# Patient Record
Sex: Female | Born: 1975 | Race: White | Hispanic: No | Marital: Married | Smoking: Never smoker
Health system: Southern US, Community
[De-identification: ages and names within clinical notes are randomized; demographics above are authoritative.]

## PROBLEM LIST (undated history)

## (undated) DIAGNOSIS — Z87440 Personal history of urinary (tract) infections: Secondary | ICD-10-CM

## (undated) DIAGNOSIS — R519 Headache, unspecified: Secondary | ICD-10-CM

## (undated) DIAGNOSIS — B999 Unspecified infectious disease: Secondary | ICD-10-CM

## (undated) DIAGNOSIS — E611 Iron deficiency: Secondary | ICD-10-CM

## (undated) DIAGNOSIS — R51 Headache: Secondary | ICD-10-CM

## (undated) HISTORY — PX: NO PAST SURGERIES: SHX2092

---

## 2005-10-15 ENCOUNTER — Other Ambulatory Visit: Admission: RE | Admit: 2005-10-15 | Discharge: 2005-10-15 | Payer: Self-pay | Admitting: Family Medicine

## 2006-03-25 ENCOUNTER — Other Ambulatory Visit: Admission: RE | Admit: 2006-03-25 | Discharge: 2006-03-25 | Payer: Self-pay | Admitting: Gynecology

## 2006-10-21 ENCOUNTER — Inpatient Hospital Stay (HOSPITAL_COMMUNITY): Admission: AD | Admit: 2006-10-21 | Discharge: 2006-10-23 | Payer: Self-pay | Admitting: Obstetrics and Gynecology

## 2008-09-03 ENCOUNTER — Inpatient Hospital Stay (HOSPITAL_COMMUNITY): Admission: AD | Admit: 2008-09-03 | Discharge: 2008-09-04 | Payer: Self-pay | Admitting: Obstetrics and Gynecology

## 2010-08-10 ENCOUNTER — Inpatient Hospital Stay (HOSPITAL_COMMUNITY): Admission: AD | Admit: 2010-08-10 | Payer: Self-pay | Admitting: Obstetrics and Gynecology

## 2010-11-25 LAB — CBC
HCT: 32.8 % — ABNORMAL LOW (ref 36.0–46.0)
Hemoglobin: 11 g/dL — ABNORMAL LOW (ref 12.0–15.0)
MCV: 83.3 fL (ref 78.0–100.0)
RBC: 3.9 MIL/uL (ref 3.87–5.11)
RBC: 4.18 MIL/uL (ref 3.87–5.11)
RDW: 14.3 % (ref 11.5–15.5)
WBC: 12.3 10*3/uL — ABNORMAL HIGH (ref 4.0–10.5)
WBC: 14.9 10*3/uL — ABNORMAL HIGH (ref 4.0–10.5)

## 2010-11-25 LAB — RPR: RPR Ser Ql: NONREACTIVE

## 2010-12-22 ENCOUNTER — Inpatient Hospital Stay (HOSPITAL_COMMUNITY)
Admission: AD | Admit: 2010-12-22 | Discharge: 2010-12-23 | DRG: 373 | Disposition: A | Discharge status: Home / Self Care | Payer: BC Managed Care – PPO | Source: Ambulatory Visit | Attending: Obstetrics and Gynecology | Admitting: Obstetrics and Gynecology

## 2010-12-22 LAB — CBC
HCT: 38.2 % (ref 36.0–46.0)
Hemoglobin: 12.6 g/dL (ref 12.0–15.0)
MCH: 27.9 pg (ref 26.0–34.0)
MCHC: 33 g/dL (ref 30.0–36.0)
MCV: 84.7 fL (ref 78.0–100.0)
RBC: 4.51 MIL/uL (ref 3.87–5.11)

## 2010-12-23 LAB — CBC
HCT: 33.6 % — ABNORMAL LOW (ref 36.0–46.0)
Hemoglobin: 10.8 g/dL — ABNORMAL LOW (ref 12.0–15.0)
MCH: 27.4 pg (ref 26.0–34.0)
MCHC: 32.1 g/dL (ref 30.0–36.0)
MCV: 85.3 fL (ref 78.0–100.0)
RDW: 14.6 % (ref 11.5–15.5)

## 2010-12-24 NOTE — H&P (Signed)
NAMEREILEY, BERTAGNOLLI                 ACCOUNT NO.:  [id number]   MEDICAL RECORD NO.:  [id number]          PATIENT TYPE:  INP   LOCATION:  9164                          [facility]ILITY:  [facility]   PHYSICIAN:  Hal Morales, M.D.DATE OF BIRTH:  1975-11-12   DATE OF ADMISSION:  09/03/2008  DATE OF DISCHARGE:                              HISTORY & PHYSICAL   Ms. Gains is a 35 year old, gravida 2, para 1-0-0-1, at 1 weeks who  presents complaining of uterine contractions since approximately 1 a.m.  with positive show noted.  Cervix had been 1 cm in the office last week.   Pregnancy has been remarkable for:  1. Positive group B strep.  2. Lacto-ovo vegetarian.  3. History of preeclampsia last pregnancy.   PRENATAL LABS:  Blood type is A positive, Rh antibody negative.  VDRL  nonreactive, rubella titer positive, hepatitis B surface antigen  negative, HIV is nonreactive.  GC and Chlamydia cultures were declined.  Pap was normal in August 2008.  Hemoglobin upon entering the practice  was 13.4.  It was within normal limits at 28 weeks.  The patient  declined genetic screening,  Parvo titers were done secondary to  possible exposure showing previous immunity.  Glucola was done and was  normal.  Hemoglobin at 28 weeks was 11.3.  Group B strep culture was  positive at 36 weeks.  She did have a urine culture done at 34 weeks  showing Enterococcus and was treated with Macrobid.   HISTORY OF PRESENT PREGNANCY:  The patient entered care at approximately  11 weeks.  She had an ultrasound in July that showed an [facility] of September 07, 2008  which was in congruence with her LMP dating of January 31. She  declined genetic screening.  Although she was a lacto-ovo vegetarian,  she does eat fish, eggs, cheese but no milk.  She was exposed to parvo  at work.  Titers were done showing immunity.  She had a normal Glucola  and hemoglobin was 11.3 at 29 weeks.  She had some significant insomnia  at 31 weeks when she was  given some Ambien.  She had questionable UTI at  35 weeks.  She had a urine culture showing 40,000 colonies of  Enterococcus which was sensitive to Macrobid.  She was treated with this  x7 days.  She had a positive group B strep culture done at 36 weeks.  The rest her pregnancy was essentially uncomplicated.   OBSTETRICAL HISTORY:  In 2008, she had a vaginal birth of a female  infant, weight 6 pounds 9 ounces at 40 weeks.  She was in labor 23  hours.  She had preeclampsia noted that was diagnosed during labor.  She  had no blood pressure trouble otherwise.  She did have positive group B  strep during that pregnancy.   MEDICAL HISTORY:  She is a previous oral contraceptive user and  discontinued this in April 2009.  She reports usual childhood illnesses.  She had a UTI in 2008. Her only known surgery was wisdom teeth removed  in 2004.  Her  only other hospitalization was for childbirth.  She did  have a fractured ankle at age 64.  She is a lacto-ovo vegetarian.   FAMILY HISTORY:  Maternal grandfather had emphysema.  Father had  prostate cancer and paternal grandmother had possible breast cancer.  Genetic history is unremarkable.   SOCIAL HISTORY:  The patient is married to the father of the baby.  He  is involved and supportive.  His name is Esti Demello.  The patient is  Caucasian of the [place] faith.  She is college educated.  She is a  Paediatric nurse. Her husband is also college educated.  He is also a  Education officer, environmental.  She has been followed by the Certified Nurse Midwife Service of  [place].  She denies any alcohol, drug or tobacco use during  this pregnancy.   PHYSICAL EXAMINATION:  VITAL SIGNS: Stable.  The patient is febrile.  HEENT:  Within normal limits.  LUNGS:  Breath sounds are clear.  HEART: Regular rate and rhythm without murmur.  BREASTS:  Soft and nontender.  ABDOMEN:  Fundal height is approximately 38 cm.  Estimated fetal weight  is 7 pounds.  Uterine  contractions are every 5-7 minutes, moderate  quality.  Cervix is slightly posterior, 3-4 cm, 8%, vertex at -1 station  with a small amount of bloody show.  Fetal heart rate is reactive with  no decelerations.  EXTREMITIES:  Deep tendon reflexes are 2+ without clonus.  There is  trace edema noted.   IMPRESSION:  1. Intrauterine pregnancy at 39 weeks.  2. Early labor.  3. Positive group B strep.   PLAN:  1. Admit to birthing suite with consult to Dr. Pennie Rushing as attending      physician.  2. Routine certified nurse midwife orders.  3. Plan group B strep prophylaxis with penicillin G per standard      dosing.  4. Patient prefers to ambulate initially but then may request an      epidural as labor advances.      Renaldo Reel Emilee Hero, C.N.M.      Hal Morales, M.D.  Electronically Signed    VLL/MEDQ  D:  09/03/2008  T:  09/03/2008  Job:  [id number]

## 2010-12-27 NOTE — H&P (Signed)
NAMETABBATHA, BORDELON NO.:  [id number]   MEDICAL RECORD NO.:  [id number]          PATIENT TYPE:  INP   LOCATION:  9111                          [facility]:  [facility]   PHYSICIAN:  Osborn Coho, M.D.   DATE OF BIRTH:  1976/04/18   DATE OF ADMISSION:  10/21/2006  DATE OF DISCHARGE:                              HISTORY & PHYSICAL   HISTORY OF PRESENT ILLNESS:  Ms. Behrmann is a 35 year old married white  female, primigravida, at 40-1/7 weeks, who presents with regular uterine  contractions since midnight and leaking fluid since about 11 p.m.  She  denies bleeding.  No signs and symptoms of PIH.  Her pregnancy has been  followed by the [facility] C.N.M. Service and has been remarkable  for:  (1) Travel to [place] at 14 weeks, (2) vegetarian lacto-ovo, (3)  group B strep positive.  Her prenatal labs were collected on March 09, 2006:  Hemoglobin 12.4, hematocrit 36.9, platelets 249,000.  Blood type  A-positive, antibody negative.  RPR nonreactive.  Rubella immune.  Hepatitis B surface antigen negative.  HIV nonreactive.  Pap smear  within normal limits from August 2007.  A 1-hour Glucola from July 28, 2006 was 96; hemoglobin at that time was 11.9; RPR at that time was  nonreactive.  Culture of the vaginal tract for group B strep on September 22, 2006 was positive.   HISTORY OF PRESENT PREGNANCY:  The patient presented for care at [facility] on April 06, 2006 at 11-6/7 weeks' gestation.  The patient had  her new OB lab work at [facility] and her Pap smear earlier in the  year; those records were received.  She declined nuchal translucency,  ultrasound and the quad screen.  CDC was reviewed with the patient in  terms of recommended vaccinations for travel to [place] and safety  information was also given.  Ultrasonography at 19-6/7 weeks' gestation  shows growth consistent with previous dating, confirming [facility] of October 20, 2006.  The patient remained  normotensive with size equal to dates  throughout the pregnancy.  Rest of her prenatal care was unremarkable.   OBSTETRICAL HISTORY:  She is a primigravida.   ALLERGIES:  She has no medication allergies.   MEDICAL HISTORY:  She experienced menarche at the age of 73 with 30-day  cycles lasting 4-5 days.  She has used Depo-Provera and oral  contraceptives in the past.  She reports having had the usual childhood  illnesses.   SURGICAL HISTORY:  Negative.   FAMILY MEDICAL HISTORY:  Remarkable for father of the baby's side of the  family with chronic hypertension, mother with anemia, father of the  baby's grandfather with COPD, father the baby's family with diabetes,  patient's father with prostate cancer, father of the baby's father with  throat cancer.   GENETIC HISTORY:  Remarkable for great uncle with cleft lip and palate.   SOCIAL HISTORY:  The patient is married to the father of the baby; his  name is Onalee Hua; he is involved and supportive.  They are of the [address]  faith.  The patient and father of the baby are college-educated.  The  patient is a part-time Paediatric nurse; father of the baby is a full-  time Education officer, environmental.  They deny any alcohol, tobacco or illicit drug use with  the pregnancy.   OBJECTIVE DATA:  VITAL SIGNS:  Stable.  She is afebrile.  HEENT:  Grossly within normal limits.  CHEST:  Clear to auscultation.  HEART:  Regular rate and rhythm.  ABDOMEN:  Gravid in contour with fundal height extending approximately  40 cm above the pubic symphysis.  Fetal heart rate is reactive and  reassuring.  Contractions are irregular and every 2-5 minutes and mild.  PELVIC:  Sterile speculum exam shows positive pooling, positive  Nitrazine, positive ferning with clear fluid.  Cervix is 1 cm, 70%  effaced, vertex -2.  EXTREMITIES:  Normal.   ASSESSMENT:  1. Intrauterine pregnancy at term.  2. Spontaneous rupture of membranes.  3. Group B strep positive.   PLAN:  1. To admit  to birthing suites.  2. Routine C.N.M. orders.  3. Plan penicillin G for group B strep prophylaxis.  4. Issue reviewed of increased risks of infection with rupture of      membranes and possible use of Pitocin today to augment      contractions.      Cam Hai, C.N.M.      Osborn Coho, M.D.  Electronically Signed    KS/MEDQ  D:  10/21/2006  T:  10/22/2006  Job:  [id number]

## 2011-11-20 ENCOUNTER — Telehealth: Payer: Self-pay | Admitting: Obstetrics and Gynecology

## 2011-11-20 NOTE — Telephone Encounter (Signed)
Patient sent email and wants to change rx for birth control.  She uses [facility] and her telphone number is [phone]. af

## 2011-11-21 ENCOUNTER — Telehealth: Payer: Self-pay | Admitting: Obstetrics and Gynecology

## 2011-11-21 NOTE — Telephone Encounter (Signed)
chandra has cht 

## 2011-11-21 NOTE — Telephone Encounter (Signed)
PC TO PT PER TELEPHONE NOTE. LM ON VM TO CB.

## 2011-11-24 MED ORDER — NORGESTIM-ETH ESTRAD TRIPHASIC 0.18/0.215/0.25 MG-35 MCG PO TABS: 1.0000 | ORAL_TABLET | Freq: Every day | ORAL | Status: DC

## 2011-11-24 NOTE — Telephone Encounter (Signed)
PC TO PT RGDG RX RF. PT STATES,"NO LONGER BREASTFEEDING AND WANTS TO RESTART ORTHO TRICYCLEN. CONSULTED WITH CORI,NP AND OK TO RF THRU 02/2012 WHEN AEX IS DUE. PT VOICES UNDERSTANDING. RX SENT TO [facility].

## 2011-11-24 NOTE — Telephone Encounter (Signed)
Addended by: Lerry Liner D on: 11/24/2011 02:18 PM   Modules accepted: Orders

## 2012-04-02 ENCOUNTER — Telehealth: Payer: Self-pay | Admitting: Obstetrics and Gynecology

## 2012-04-02 NOTE — Telephone Encounter (Signed)
JACKIE/PAM/RX REQ.

## 2012-04-05 ENCOUNTER — Telehealth: Payer: Self-pay

## 2012-04-05 NOTE — Telephone Encounter (Signed)
Pharmacy called requesting RF on Pt's OTC bcp. OK x 1 per protocol,but pt needs to sched AEX prior to further RF's. JO, CMA.

## 2012-05-20 LAB — OB RESULTS CONSOLE ABO/RH

## 2012-05-20 LAB — OB RESULTS CONSOLE HEPATITIS B SURFACE ANTIGEN: Hepatitis B Surface Ag: NEGATIVE

## 2012-05-20 LAB — OB RESULTS CONSOLE RUBELLA ANTIBODY, IGM: Rubella: IMMUNE

## 2012-05-20 LAB — OB RESULTS CONSOLE ANTIBODY SCREEN: Antibody Screen: NEGATIVE

## 2012-05-20 LAB — OB RESULTS CONSOLE HIV ANTIBODY (ROUTINE TESTING): HIV: NONREACTIVE

## 2012-08-11 NOTE — L&D Delivery Note (Signed)
Delivery Note At 6:16 PM a non-viable female was delivered via Vaginal, Spontaneous Delivery.  APGAR: 0, 0; weight 9.2 oz (260 g).   Placenta status: Intact, Spontaneous.  Cord pH: not collected  Called to rm for delivery, when I entered room baby was in mothers arms, and placenta in container on delivery table.  Placenta examined and intact.  Offered comfort, pt requested Motrin.  Anesthesia: None  Episiotomy: None Lacerations: None Suture Repair: n/a Est. Blood Loss (mL): 100  Mom to med-surg for over night observation.  Baby to mother, then to warmer to prepare to pictures and to weigh.. Mother appropriately grieving and in stable condition when I left the room.  Holly Stokes 02/09/2013, 7:01 PM

## 2012-11-17 LAB — OB RESULTS CONSOLE ABO/RH: RH Type: POSITIVE

## 2012-11-17 LAB — OB RESULTS CONSOLE RPR: RPR: NONREACTIVE

## 2012-11-17 LAB — OB RESULTS CONSOLE HEPATITIS B SURFACE ANTIGEN: Hepatitis B Surface Ag: NEGATIVE

## 2013-01-25 ENCOUNTER — Other Ambulatory Visit: Payer: Self-pay

## 2013-01-26 ENCOUNTER — Encounter (HOSPITAL_COMMUNITY): Payer: Self-pay | Admitting: Obstetrics and Gynecology

## 2013-01-26 ENCOUNTER — Other Ambulatory Visit: Payer: Self-pay | Admitting: Obstetrics and Gynecology

## 2013-02-02 ENCOUNTER — Ambulatory Visit (HOSPITAL_COMMUNITY): Payer: BC Managed Care – PPO

## 2013-02-02 ENCOUNTER — Encounter (HOSPITAL_COMMUNITY): Payer: BC Managed Care – PPO

## 2013-02-04 ENCOUNTER — Inpatient Hospital Stay (HOSPITAL_COMMUNITY)
Admission: AD | Admit: 2013-02-04 | Discharge: 2013-02-04 | Discharge status: Against Medical Advice | Payer: BC Managed Care – PPO | Attending: Obstetrics and Gynecology | Admitting: Obstetrics and Gynecology

## 2013-02-04 ENCOUNTER — Ambulatory Visit (HOSPITAL_COMMUNITY)
Admission: RE | Admit: 2013-02-04 | Discharge: 2013-02-04 | Disposition: A | Discharge status: Home / Self Care | Payer: BC Managed Care – PPO | Source: Ambulatory Visit | Attending: Obstetrics and Gynecology | Admitting: Obstetrics and Gynecology

## 2013-02-04 ENCOUNTER — Encounter (HOSPITAL_COMMUNITY): Payer: Self-pay

## 2013-02-04 ENCOUNTER — Ambulatory Visit (HOSPITAL_COMMUNITY): Payer: BC Managed Care – PPO

## 2013-02-04 ENCOUNTER — Other Ambulatory Visit: Payer: Self-pay | Admitting: Obstetrics and Gynecology

## 2013-02-04 VITALS — BP 126/87 | HR 112 | Wt 159.0 lb

## 2013-02-04 DIAGNOSIS — Z1389 Encounter for screening for other disorder: Secondary | ICD-10-CM | POA: Insufficient documentation

## 2013-02-04 DIAGNOSIS — Z363 Encounter for antenatal screening for malformations: Secondary | ICD-10-CM | POA: Insufficient documentation

## 2013-02-04 DIAGNOSIS — O021 Missed abortion: Secondary | ICD-10-CM | POA: Insufficient documentation

## 2013-02-04 DIAGNOSIS — O358XX0 Maternal care for other (suspected) fetal abnormality and damage, not applicable or unspecified: Secondary | ICD-10-CM | POA: Insufficient documentation

## 2013-02-04 NOTE — Progress Notes (Addendum)
Holly Stokes  was seen today for an ultrasound appointment.  See full report in [facility].  Comments: [name] was seen today due to an enlarged fetal bladder and bilateral renal pylectasis that was noted on recent outside ultrasound.  She also reports some possible leakage of fluid. On exam, an intrauterine fetal demise is noted.  The amniotic fluid volume appears subjectively decreased with a single fluid pocket noted near the uterine fundus measuring 3.1 cm.  Limited views of the fetal anatomy were obtained due to fetal position and subjectively decreased amnotic fluid.    Impression: Single IUP at 20 1/7 weeks by dates Intrauterine fetal demise BPD by today's study c/w 17 1/7 weeks  Recommendations: Findings discussed with Dr. Pennie Rushing who will make arrangements for labor induction.  Alpha Gula, MD

## 2013-02-04 NOTE — ED Notes (Signed)
Pt states she had some leaking yesterday.  Called her midwife and discussed with her.  Advised everything was ok.

## 2013-02-08 ENCOUNTER — Encounter (HOSPITAL_COMMUNITY): Payer: Self-pay | Admitting: *Deleted

## 2013-02-08 ENCOUNTER — Encounter (HOSPITAL_COMMUNITY): Payer: Self-pay | Admitting: Obstetrics and Gynecology

## 2013-02-08 ENCOUNTER — Inpatient Hospital Stay (HOSPITAL_COMMUNITY)
Admission: AD | Admit: 2013-02-08 | Discharge: 2013-02-10 | DRG: 380 | Disposition: A | Discharge status: Home / Self Care | Payer: BC Managed Care – PPO | Source: Ambulatory Visit | Attending: Obstetrics and Gynecology | Admitting: Obstetrics and Gynecology

## 2013-02-08 DIAGNOSIS — O99891 Other specified diseases and conditions complicating pregnancy: Secondary | ICD-10-CM | POA: Diagnosis present

## 2013-02-08 DIAGNOSIS — O2340 Unspecified infection of urinary tract in pregnancy, unspecified trimester: Secondary | ICD-10-CM | POA: Diagnosis present

## 2013-02-08 DIAGNOSIS — O358XX0 Maternal care for other (suspected) fetal abnormality and damage, not applicable or unspecified: Secondary | ICD-10-CM | POA: Diagnosis present

## 2013-02-08 DIAGNOSIS — IMO0002 Reserved for concepts with insufficient information to code with codable children: Secondary | ICD-10-CM | POA: Diagnosis present

## 2013-02-08 DIAGNOSIS — O09529 Supervision of elderly multigravida, unspecified trimester: Secondary | ICD-10-CM | POA: Diagnosis present

## 2013-02-08 DIAGNOSIS — O021 Missed abortion: Principal | ICD-10-CM | POA: Diagnosis present

## 2013-02-08 DIAGNOSIS — O09299 Supervision of pregnancy with other poor reproductive or obstetric history, unspecified trimester: Secondary | ICD-10-CM

## 2013-02-08 DIAGNOSIS — Z22330 Carrier of Group B streptococcus: Secondary | ICD-10-CM

## 2013-02-08 DIAGNOSIS — B951 Streptococcus, group B, as the cause of diseases classified elsewhere: Secondary | ICD-10-CM | POA: Diagnosis present

## 2013-02-08 LAB — CBC
HCT: 37.1 % (ref 36.0–46.0)
Hemoglobin: 13 g/dL (ref 12.0–15.0)
MCHC: 35 g/dL (ref 30.0–36.0)

## 2013-02-08 LAB — DIC (DISSEMINATED INTRAVASCULAR COAGULATION)PANEL
Fibrinogen: 451 mg/dL (ref 204–475)
Smear Review: NONE SEEN
aPTT: 28 seconds (ref 24–37)

## 2013-02-08 LAB — OB RESULTS CONSOLE GBS: GBS: POSITIVE

## 2013-02-08 MED ORDER — CITRIC ACID-SODIUM CITRATE 334-500 MG/5ML PO SOLN: 30.0000 mL | ORAL | Status: DC | PRN

## 2013-02-08 MED ORDER — FLEET ENEMA 7-19 GM/118ML RE ENEM: 1.0000 | ENEMA | RECTAL | Status: DC | PRN

## 2013-02-08 MED ORDER — MISOPROSTOL 200 MCG PO TABS
200.0000 ug | ORAL_TABLET | Freq: Once | ORAL | Status: AC
  Administered 2013-02-08: 200 ug via VAGINAL
  Filled 2013-02-08 (×2): qty 1

## 2013-02-08 MED ORDER — MISOPROSTOL 200 MCG PO TABS
400.0000 ug | ORAL_TABLET | Freq: Once | ORAL | Status: AC
  Administered 2013-02-09: 400 ug via VAGINAL
  Filled 2013-02-08: qty 2

## 2013-02-08 MED ORDER — ONDANSETRON HCL 4 MG/2ML IJ SOLN: 4.0000 mg | Freq: Four times a day (QID) | INTRAMUSCULAR | Status: DC | PRN

## 2013-02-08 MED ORDER — IBUPROFEN 600 MG PO TABS
600.0000 mg | ORAL_TABLET | Freq: Four times a day (QID) | ORAL | Status: DC | PRN
  Administered 2013-02-09: 600 mg via ORAL
  Filled 2013-02-08 (×2): qty 1

## 2013-02-08 MED ORDER — ACETAMINOPHEN 325 MG PO TABS: 650.0000 mg | ORAL_TABLET | ORAL | Status: DC | PRN

## 2013-02-08 MED ORDER — OXYTOCIN 40 UNITS IN LACTATED RINGERS INFUSION - SIMPLE MED: 62.5000 mL/h | INTRAVENOUS | Status: DC

## 2013-02-08 MED ORDER — OXYTOCIN BOLUS FROM INFUSION: 500.0000 mL | INTRAVENOUS | Status: DC

## 2013-02-08 MED ORDER — OXYCODONE-ACETAMINOPHEN 5-325 MG PO TABS: 1.0000 | ORAL_TABLET | ORAL | Status: DC | PRN

## 2013-02-08 MED ORDER — LIDOCAINE HCL (PF) 1 % IJ SOLN
30.0000 mL | INTRAMUSCULAR | Status: DC | PRN
  Filled 2013-02-08: qty 30

## 2013-02-08 MED ORDER — LACTATED RINGERS IV SOLN: 500.0000 mL | INTRAVENOUS | Status: DC | PRN

## 2013-02-08 MED ORDER — DIPHENHYDRAMINE HCL 50 MG/ML IJ SOLN
12.5000 mg | INTRAMUSCULAR | Status: DC | PRN
  Administered 2013-02-08: 12.5 mg via INTRAVENOUS
  Filled 2013-02-08: qty 1

## 2013-02-08 MED ORDER — MISOPROSTOL 25 MCG QUARTER TABLET
100.0000 ug | ORAL_TABLET | Freq: Once | ORAL | Status: AC
  Administered 2013-02-08: 100 ug via VAGINAL

## 2013-02-08 MED ORDER — LACTATED RINGERS IV SOLN
INTRAVENOUS | Status: DC
  Administered 2013-02-08: 20:00:00 via INTRAVENOUS

## 2013-02-08 NOTE — Consult Note (Signed)
Physician Discharge Summary  Patient ID: Holly Stokes MRN: [id number] DOB/AGE: 37/27/1977 37 y.o.  Admit date: 02/04/2013 Discharge date: 02/04/13  Admission Diagnoses: [redacted] wk gestation     Abnormal ultrasound     Intrauterine fetal demise     Questionable fluid leakage  Discharge Diagnoses: [redacted] week gestation     Abnormal ultrasound     Intrauterine fetal demise     No evidence rupture of membranes     Pt declines recommended admission for induction of labor   Discharged Condition: stable  Hospital Course: the patient was seen by Dr. Claudean Severance in MFM with the diagnosis of IUFD made by ultrasound. I was notified of this finding and spoke with the patient in MFM.  I expressed by condolences for her loss.  Induction of labor was recommended.  Pt declined and was the risk of development of DIC with its potential life threatening sequalae reviewed.  She gave a hx of possibly leaking fluid yesterday but denied any contractions or bleeding.  Amnisure was performed and was negative.  VS were stable.  The patient consulted with her family members and together they decided to delay induction until Monday 02/07/13.  She was given precautions to call for leakage of fluid, bleeding or contractions.  Consults: MFM  Significant Diagnostic Studies: Ultrasound and Amnisure  Treatments: none  Discharge Exam: Cx closed per exam by Denny Levy CNM  Disposition: In light of the negative Amnisure, the patient wants to go home and continue to think about her options.  She said she wanted to delay induction until 02/07/13     Signed: Lawsen Arnott P 02/08/2013, 11:32 AM

## 2013-02-08 NOTE — Progress Notes (Signed)
Subjective: Denies cramping/abdominal pain.  Pt was sleeping on couch on arrival to room and husband was sitting in bed watching tv.  Pt denies VB or LOF.  Objective: BP 128/66  Pulse 71  Temp(Src) 98.2 F (36.8 C) (Oral)  Ht 5\' 7"  (1.702 m)  Wt 160 lb (72.576 kg)  BMI 25.05 kg/m2      SVE:   Dilation: Fingertip Exam by:: Paulette Blanch CNM  Labs: Lab Results  Component Value Date   WBC 13.9* 02/08/2013   HGB 13.0 02/08/2013   HCT 37.1 02/08/2013   MCV 86.1 02/08/2013   PLT 288 02/08/2013   PLT 288 02/08/2013    Assessment / Plan: 1. 21 weeks 2. IUFD 3. AMA 4. megabladder on u/s  Labor: induction in progress Preeclampsia:  no signs or symptoms of toxicity Fetal Wellbeing:  n/a Pain Control:  Labor support without medications I/D:  n/a Anticipated MOD:  NSVD 1. Placed 2nd dose of cytotec ( this time).   2. Offered sleep aid and pt to consider, but appears interested in Benadryl IV 3. CTO as needed; pain interventions prn pt's desire 4. C/w MD prn  Christ Fullenwider H 02/08/2013, 10:58 PM

## 2013-02-08 NOTE — H&P (Signed)
Holly Stokes is a 37 y.o. female, [phone] at [redacted]w[redacted]d, presenting for IOL after IUFD about a week ago.  Patient Active Problem List   Diagnosis Date Noted  . GBS (group B streptococcus) UTI complicating pregnancy 03-09-13  . H/O pre-eclampsia in prior pregnancy, currently pregnant 03-09-13  . IUFD (intrauterine fetal death) 2013/03/09    History of present pregnancy: Patient entered care at 10 weeks.   EDC of 06/21/13 was established by U/S at [redacted]w[redacted]d.   Anatomy scan:  19 weeks, with normal findings and an anterior placenta.  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   Significant prenatal events:  none   Last evaluation:  03/09/13 at 21 weeks  OB History   Grav Para Term Preterm Abortions TAB SAB Ect Mult Living   4 3 3  0 0 0 0 0 0 3     Past Medical History  Diagnosis Date  . Pregnancy induced hypertension 2008   Past Surgical History  Procedure Laterality Date  . No past surgeries     Family History: family history is not on file. Social History:  reports that she has never smoked. She has never used smokeless tobacco. She reports that she does not drink alcohol or use illicit drugs.    ROS: see HPI above, all other systems are negative  No Known Allergies     Blood pressure 128/66, pulse 71, temperature 98.2 F (36.8 C), temperature source Oral, height 5\' 7"  (1.702 m), weight 160 lb (72.576 kg).  Chest clear Heart RRR without murmur Abd gravid, NT Pelvic: deferred until cyctotec placement Ext: WNL   Prenatal labs: ABO, Rh: A/Positive/-- (04/09 0000) Antibody: Negative (04/09 0000) Rubella:   Immune RPR: Nonreactive (04/09 0000)  HBsAg: Negative (04/09 0000)  HIV: Non-reactive (04/09 0000)  GBS:  Pos by urine culture Sickle cell/Hgb electrophoresis:  n/a Pap:  02/13/11 WNL GC:  neg Chlamydia:  neg Genetic  screenings:  declined Glucola:  n/a Other:  none  Assessment/Plan: IUFD at [redacted]w[redacted]d High risk PPH  Admit to BS for IOL per c/w Dr. Pennie Rushing as attending Cytotec induction PCA or Epidural prn DIC panel To consent for D&E and Hysterotomy   Rowan Blase, MSN March 09, 2013, 5:46 PM

## 2013-02-09 ENCOUNTER — Encounter (HOSPITAL_COMMUNITY): Payer: Self-pay | Admitting: *Deleted

## 2013-02-09 DIAGNOSIS — IMO0002 Reserved for concepts with insufficient information to code with codable children: Secondary | ICD-10-CM | POA: Diagnosis not present

## 2013-02-09 MED ORDER — ONDANSETRON HCL 4 MG/2ML IJ SOLN: 4.0000 mg | INTRAMUSCULAR | Status: DC | PRN

## 2013-02-09 MED ORDER — WITCH HAZEL-GLYCERIN EX PADS: 1.0000 "application " | MEDICATED_PAD | CUTANEOUS | Status: DC | PRN

## 2013-02-09 MED ORDER — TETANUS-DIPHTH-ACELL PERTUSSIS 5-2.5-18.5 LF-MCG/0.5 IM SUSP: 0.5000 mL | Freq: Once | INTRAMUSCULAR | Status: DC

## 2013-02-09 MED ORDER — OXYCODONE-ACETAMINOPHEN 5-325 MG PO TABS: 1.0000 | ORAL_TABLET | ORAL | Status: DC | PRN

## 2013-02-09 MED ORDER — ZOLPIDEM TARTRATE 5 MG PO TABS: 5.0000 mg | ORAL_TABLET | Freq: Every evening | ORAL | Status: DC | PRN

## 2013-02-09 MED ORDER — ONDANSETRON HCL 4 MG PO TABS: 4.0000 mg | ORAL_TABLET | ORAL | Status: DC | PRN

## 2013-02-09 MED ORDER — SIMETHICONE 80 MG PO CHEW: 80.0000 mg | CHEWABLE_TABLET | ORAL | Status: DC | PRN

## 2013-02-09 MED ORDER — DIPHENHYDRAMINE HCL 25 MG PO CAPS
25.0000 mg | ORAL_CAPSULE | Freq: Four times a day (QID) | ORAL | Status: DC | PRN
  Administered 2013-02-09: 25 mg via ORAL
  Filled 2013-02-09: qty 1

## 2013-02-09 MED ORDER — BENZOCAINE-MENTHOL 20-0.5 % EX AERO
1.0000 "application " | INHALATION_SPRAY | CUTANEOUS | Status: DC | PRN
  Filled 2013-02-09: qty 56

## 2013-02-09 MED ORDER — SENNOSIDES-DOCUSATE SODIUM 8.6-50 MG PO TABS
2.0000 | ORAL_TABLET | Freq: Every day | ORAL | Status: DC
  Administered 2013-02-09: 2 via ORAL

## 2013-02-09 MED ORDER — LANOLIN HYDROUS EX OINT: TOPICAL_OINTMENT | CUTANEOUS | Status: DC | PRN

## 2013-02-09 MED ORDER — FENTANYL CITRATE 0.05 MG/ML IJ SOLN
100.0000 ug | INTRAMUSCULAR | Status: DC | PRN
  Filled 2013-02-09: qty 2

## 2013-02-09 MED ORDER — PRENATAL MULTIVITAMIN CH: 1.0000 | ORAL_TABLET | Freq: Every day | ORAL | Status: DC

## 2013-02-09 MED ORDER — DIBUCAINE 1 % RE OINT: 1.0000 "application " | TOPICAL_OINTMENT | RECTAL | Status: DC | PRN

## 2013-02-09 MED ORDER — MISOPROSTOL 200 MCG PO TABS
800.0000 ug | ORAL_TABLET | Freq: Once | ORAL | Status: AC
  Administered 2013-02-09: 800 ug via VAGINAL
  Filled 2013-02-09: qty 4

## 2013-02-09 MED ORDER — IBUPROFEN 600 MG PO TABS
600.0000 mg | ORAL_TABLET | Freq: Four times a day (QID) | ORAL | Status: DC
  Filled 2013-02-09 (×3): qty 1

## 2013-02-09 NOTE — Progress Notes (Signed)
02/09/13 0900  Clinical Encounter Type  Visited With Patient and family together (husband Holly Stokes)  Visit Type Initial;Spiritual support;Social support  Referral From Nurse Venda Rodes, RN)  Recommendations Spiritual Care will follow for support.  Spiritual Encounters  Spiritual Needs Grief support;Emotional  Stress Factors  Patient Stress Factors Loss (IUFD)  Family Stress Factors Loss (IUFD)   Thank you to Venda Rodes, RN for consult.  Made initial visit with Holly Stokes and husband Holly Stokes to offer spiritual and emotional support.  They report good support, desire space to themselves at this time, and are aware of ongoing chaplain availability.  Will follow for support.  [address] [place], [place] [phone]

## 2013-02-09 NOTE — Progress Notes (Signed)
  Subjective: Called to bedside by RN d/t pt reporting more cramping and some vaginal pressure. Pt sitting in bed.   Objective: BP 115/55  Pulse 70  Temp(Src) 98.7 F (37.1 C) (Oral)  Resp 18  Ht 5\' 7"  (1.702 m)  Wt 160 lb (72.576 kg)  BMI 25.05 kg/m2      SVE:   Dilation: 1.5 Effacement (%): 50 Exam by:: jeenifer Dietrich Samuelson, cnm -3 Posterior Soft Many tablets noted in vaginal vault  Assessment / Plan:  Per c/w Dr. Normand Sloop will continue to let the Cytotec dissolve Toco applied for UC monitoring  Labor: IOL for IUFD Preeclampsia: no s/s Fetal Wellbeing: IUFD Pain Control: Labor support without medication  I/D: n/a  Anticipated MOD: NSVD    Krysia Zahradnik 02/09/2013, 11:21 AM

## 2013-02-09 NOTE — Progress Notes (Signed)
  Subjective: Pt sitting in bed watching tv, husband in shower.  Pt reports she got some sleep through the night, now feeling mild UC/cramping.  Objective: BP 115/55  Pulse 70  Temp(Src) 98.7 F (37.1 C) (Oral)  Resp 18  Ht 5\' 7"  (1.702 m)  Wt 160 lb (72.576 kg)  BMI 25.05 kg/m2     SVE: FT / 50% - about 3-4 tabs noted in vaginal vault   Assessment / Plan:  4th dose of cytotec ( ) placed per c/w Dr. Normand Sloop  Labor: IOL for IUFD  Preeclampsia: No s/s Fetal Wellbeing: IUFD Pain Control: Labor support without medication I/D: DIC panel WNL on admission Anticipated MOD: NSVD   Holly Stokes 02/09/2013, 8:03 AM

## 2013-02-09 NOTE — Progress Notes (Signed)
Subjective: Pt awake on arrival to room.  Denies VB or LOF.  Reports occ'l "twinge" in LLQ, but not constant and denies need for pain medicine.  States is sharp pain, doesn't feel like a ctx.  Husband is asleep on couch at bs.  Good benefit from Benadryl IV at 2309.    Objective: BP 117/64  Pulse 69  Temp(Src) 99.6 F (37.6 C) (Oral)  Resp 18  Ht 5\' 7"  (1.702 m)  Wt 160 lb (72.576 kg)  BMI 25.05 kg/m2      SVE:   Dilation: Fingertip Effacement (%): Thick Exam by:: H Iris Hairston CNM Cx: unchanged; remnants of previous 2 cytotec pills found in vault Labs: Lab Results  Component Value Date   WBC 13.9* 02/08/2013   HGB 13.0 02/08/2013   HCT 37.1 02/08/2013   MCV 86.1 02/08/2013   PLT 288 02/08/2013   PLT 288 02/08/2013    Assessment / Plan: 1. 21weeks 2. IUFD 3. AMA 4. megabladder on u/s  Labor: induction in progress Preeclampsia:  no signs or symptoms of toxicity Fetal Wellbeing:  IUFD Pain Control:  Labor support without medications I/D:  n/a Anticipated MOD:  NSVD 1. Placed 3rd dose of cytotec ( this time) 2. CTO for labor 3. Pain interventions prn pt desire (not interested in epidural presently) 4. C/w MD prn  Harleyquinn Gasser H 02/09/2013, 3:11 AM

## 2013-02-10 LAB — CBC
Hemoglobin: 12.1 g/dL (ref 12.0–15.0)
MCH: 29.5 pg (ref 26.0–34.0)
MCV: 86.1 fL (ref 78.0–100.0)
Platelets: 271 10*3/uL (ref 150–400)
RBC: 4.1 MIL/uL (ref 3.87–5.11)
WBC: 9.9 10*3/uL (ref 4.0–10.5)

## 2013-02-10 NOTE — Discharge Summary (Signed)
Vaginal Delivery Discharge Summary  Holly Stokes  DOB:    05-31-1976 MRN:    [id number] CSN:    [id number]  Date of admission:                  02/08/13  Date of discharge:                   7/3/4  Procedures this admission:  Date of Delivery: 02/09/13  Newborn Data:  IUFD at 24 1/7 weeks Birth Weight: 9.2 oz (260 g) APGAR: 0, 0  Baby Boy Samuel  History of Present Illness:  Holly Stokes is a 37 y.o. female, [phone], who presented at [redacted] weeks gestation. The patient has been followed at the [facility] and Gynecology division of [facility]. She was admitted induction of labor due to IUFD at 74 1/2 weeks, noted on office [place] on 02/08/13.Marland Kitchen Her pregnancy has been complicated by:  Patient Active Problem List   Diagnosis Date Noted  . Fetal demise, less than 22 weeks, delivered, current hospitalization 02/09/2013  . H/O pre-eclampsia in prior pregnancy, currently pregnant 02/08/2013     Hospital course:  The patient was admitted for induction of labor due to IUFD at 21 weeks.   Cytotech was given, with accomplishment of precipitous delivery on 02/09/13.  Her labor was not complicated.  Her delivery was not complicated. Her postpartum course was not complicated.   She was grieving appropriately and desired d/c home on 02/10/13.  She was discharged to home on postpartum day 1 in stable condition.  She declined pain medication.  Perinatal grief support materials were provided to the patient. The patient declined any postmortem evaluation of the infant.  Contraception:  Undecided at present  Discharge hemoglobin:  Hemoglobin  Date Value Range Status  02/10/2013 12.1  12.0 - 15.0 g/dL Final     HCT  Date Value Range Status  02/10/2013 35.3* 36.0 - 46.0 % Final    Discharge Physical Exam:   General: alert Lochia: appropriate Uterine Fundus: firm Incision: NA DVT Evaluation: No evidence of DVT seen on physical exam. Negative Homan's  sign.  Intrapartum Procedures: SVD of IUFD at 21 1/7 weeks Postpartum Procedures: none Complications-Operative and Postpartum: none  Discharge Diagnoses: IUFD at 21 weeks, induction of labor, SVB  Discharge Information:  Activity:           pelvic rest Diet:                routine Medications: None Condition:      stable Instructions:  Call for any questions or concerns. Discharge to: home  Follow-up Information   Follow up with [facility] In 4 weeks. (Call for any questions or concerns.  Office will call you to make f/u appointment for 4 weeks.)    Contact information:   [address] [place] [address] [phone]     Office staff will call and check on patient status in 2 weeks.  Nigel Bridgeman 02/10/2013

## 2013-02-14 ENCOUNTER — Encounter (HOSPITAL_COMMUNITY): Payer: Self-pay | Admitting: *Deleted

## 2014-06-08 LAB — OB RESULTS CONSOLE RUBELLA ANTIBODY, IGM: RUBELLA: IMMUNE

## 2014-06-08 LAB — OB RESULTS CONSOLE HIV ANTIBODY (ROUTINE TESTING): HIV: NONREACTIVE

## 2014-06-08 LAB — OB RESULTS CONSOLE ABO/RH: RH TYPE: POSITIVE

## 2014-06-08 LAB — OB RESULTS CONSOLE GC/CHLAMYDIA
Chlamydia: NEGATIVE
Gonorrhea: NEGATIVE

## 2014-06-08 LAB — OB RESULTS CONSOLE RPR: RPR: NONREACTIVE

## 2014-06-08 LAB — OB RESULTS CONSOLE ANTIBODY SCREEN: ANTIBODY SCREEN: NEGATIVE

## 2014-06-08 LAB — OB RESULTS CONSOLE HEPATITIS B SURFACE ANTIGEN: Hepatitis B Surface Ag: NEGATIVE

## 2014-06-12 ENCOUNTER — Encounter (HOSPITAL_COMMUNITY): Payer: Self-pay | Admitting: *Deleted

## 2014-06-15 ENCOUNTER — Other Ambulatory Visit (HOSPITAL_COMMUNITY)
Admission: RE | Admit: 2014-06-15 | Discharge: 2014-06-15 | Disposition: A | Discharge status: Home / Self Care | Payer: BC Managed Care – PPO | Source: Ambulatory Visit | Attending: Obstetrics & Gynecology | Admitting: Obstetrics & Gynecology

## 2014-06-15 ENCOUNTER — Other Ambulatory Visit: Payer: Self-pay | Admitting: Obstetrics & Gynecology

## 2014-06-15 DIAGNOSIS — Z113 Encounter for screening for infections with a predominantly sexual mode of transmission: Secondary | ICD-10-CM | POA: Diagnosis present

## 2014-06-15 DIAGNOSIS — Z01419 Encounter for gynecological examination (general) (routine) without abnormal findings: Secondary | ICD-10-CM | POA: Insufficient documentation

## 2014-06-15 DIAGNOSIS — Z1151 Encounter for screening for human papillomavirus (HPV): Secondary | ICD-10-CM | POA: Diagnosis present

## 2014-06-16 LAB — CYTOLOGY - PAP

## 2014-08-11 NOTE — L&D Delivery Note (Signed)
Delivery Note At 12:25 AM a viable female was delivered via Vaginal, Spontaneous Delivery (Presentation: ; Occiput Anterior).  APGAR: 8, 9; weight 7 lb 9.7 oz (3450 g).   Placenta status: Intact, Spontaneous.  Cord: 2 vessels with the following complications: None.  Cord pH: NA  Anesthesia: Epidural  Episiotomy: None Lacerations: 1st degree Suture Repair: 3.0 vicryl Est. Blood Loss (mL): 182  Mom to postpartum.  Baby to Couplet care / Skin to Skin.  Hasset Chaviano,Shaquilla J. 01/23/2015, 9:11 AM

## 2014-08-14 ENCOUNTER — Other Ambulatory Visit (HOSPITAL_COMMUNITY): Payer: Self-pay | Admitting: Obstetrics & Gynecology

## 2014-08-14 DIAGNOSIS — Z3689 Encounter for other specified antenatal screening: Secondary | ICD-10-CM

## 2014-08-14 DIAGNOSIS — O09522 Supervision of elderly multigravida, second trimester: Secondary | ICD-10-CM

## 2014-08-14 DIAGNOSIS — Z3A19 19 weeks gestation of pregnancy: Secondary | ICD-10-CM

## 2014-08-22 ENCOUNTER — Other Ambulatory Visit (HOSPITAL_COMMUNITY): Payer: Self-pay

## 2014-08-22 ENCOUNTER — Encounter (HOSPITAL_COMMUNITY): Payer: Self-pay

## 2014-08-25 ENCOUNTER — Other Ambulatory Visit (HOSPITAL_COMMUNITY): Payer: Self-pay

## 2014-08-25 ENCOUNTER — Ambulatory Visit (HOSPITAL_COMMUNITY)
Admission: RE | Admit: 2014-08-25 | Discharge: 2014-08-25 | Disposition: A | Discharge status: Home / Self Care | Payer: BLUE CROSS/BLUE SHIELD | Source: Ambulatory Visit | Attending: Obstetrics & Gynecology | Admitting: Obstetrics & Gynecology

## 2014-08-25 ENCOUNTER — Encounter (HOSPITAL_COMMUNITY): Payer: Self-pay

## 2014-08-25 DIAGNOSIS — O09529 Supervision of elderly multigravida, unspecified trimester: Secondary | ICD-10-CM | POA: Insufficient documentation

## 2014-08-25 DIAGNOSIS — O09522 Supervision of elderly multigravida, second trimester: Secondary | ICD-10-CM

## 2014-08-25 DIAGNOSIS — Z3A19 19 weeks gestation of pregnancy: Secondary | ICD-10-CM | POA: Diagnosis not present

## 2014-08-25 DIAGNOSIS — Z36 Encounter for antenatal screening of mother: Secondary | ICD-10-CM | POA: Diagnosis present

## 2014-08-25 DIAGNOSIS — Z3689 Encounter for other specified antenatal screening: Secondary | ICD-10-CM

## 2014-08-25 DIAGNOSIS — O09292 Supervision of pregnancy with other poor reproductive or obstetric history, second trimester: Secondary | ICD-10-CM | POA: Diagnosis not present

## 2014-08-25 IMAGING — US US OB DETAIL+14 WK
1 series · 15 of 28 positions shown · non-contrast
Comparison: none

[Series 1: ama, · 0.26mm/px · 112 acquisitions, 15 frames shown]
[im 1/112]
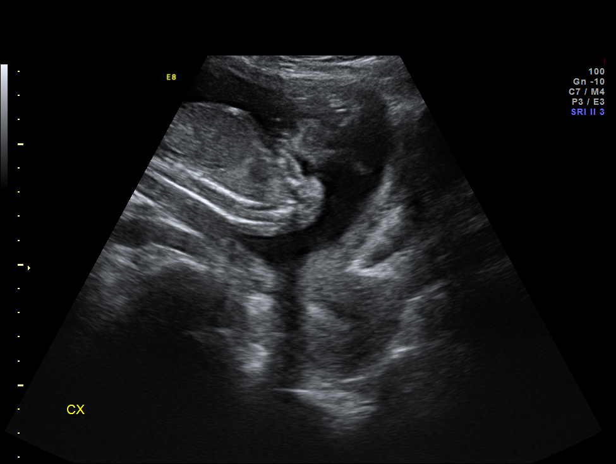
[im 9/112]
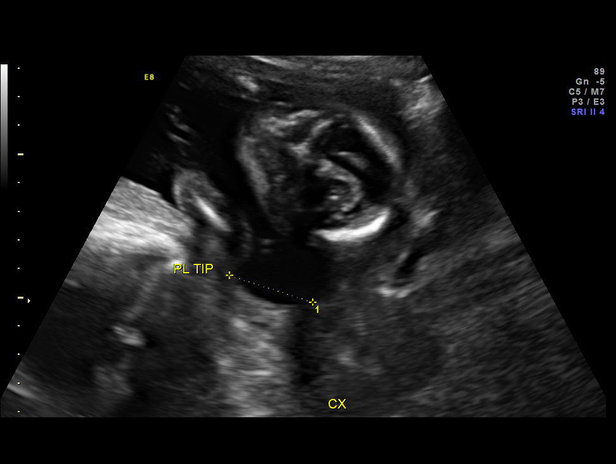
[im 17/112]
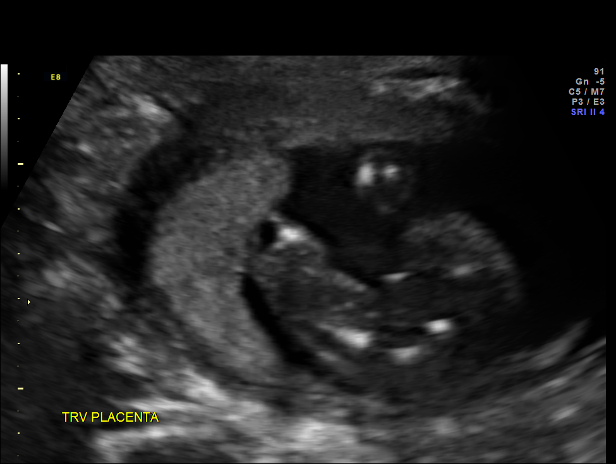
[im 25/112]
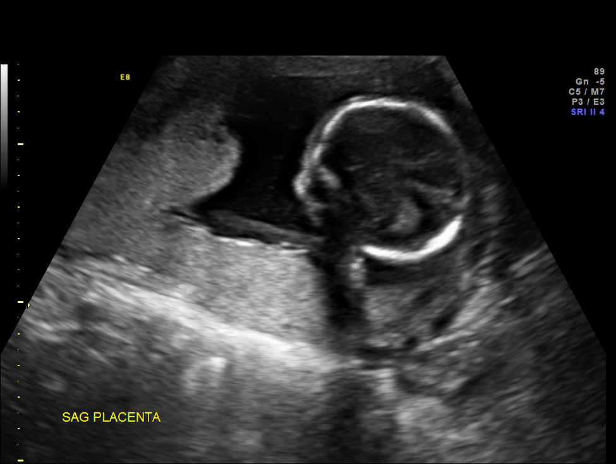
[im 33/112]
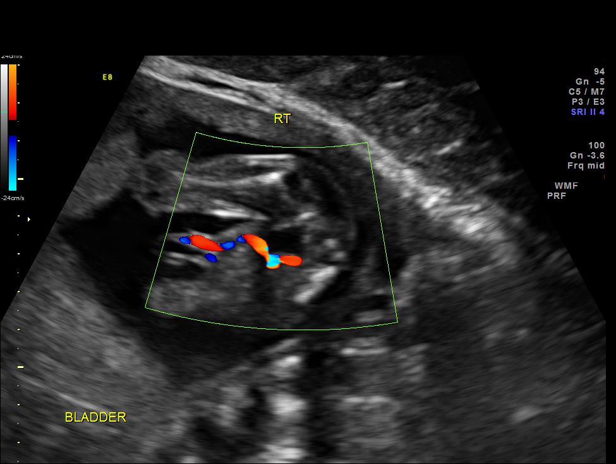
[im 42/112]
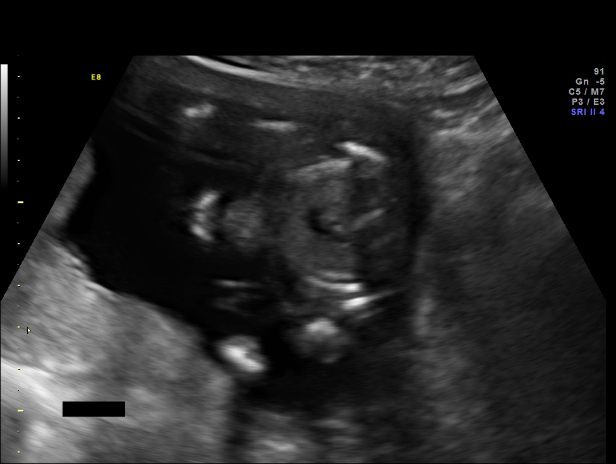
[im 50/112]
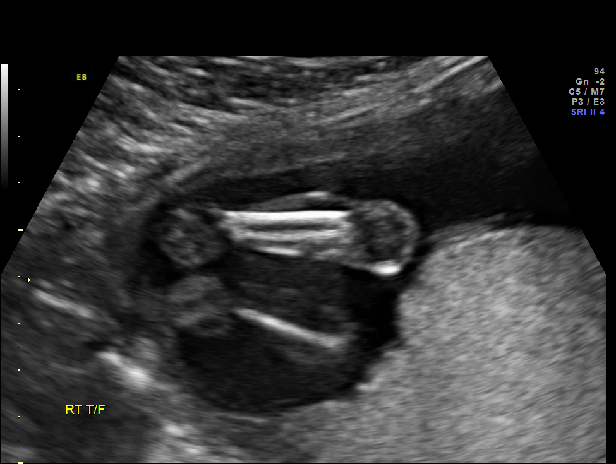
[im 58/112]
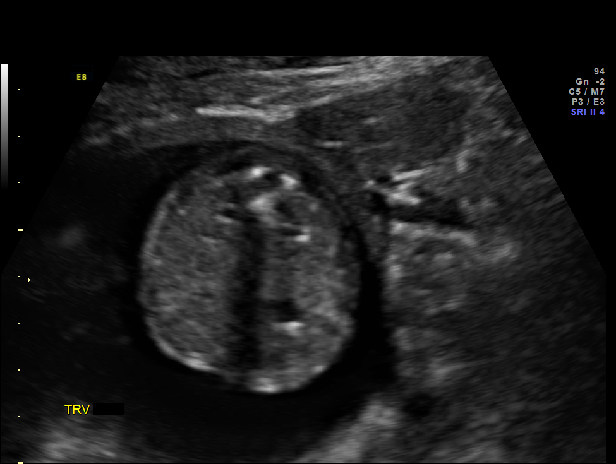
[im 62/112]
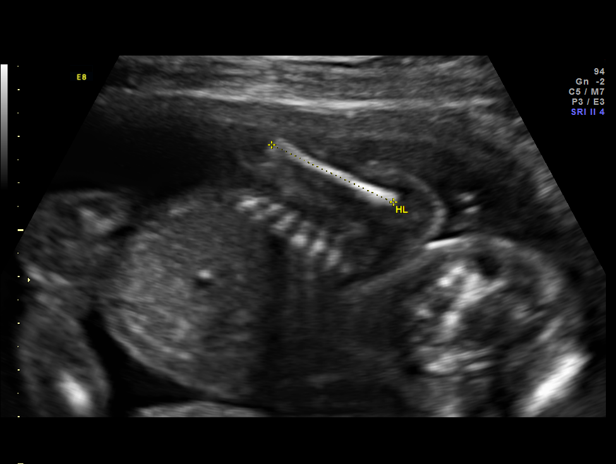
[im 70/112]
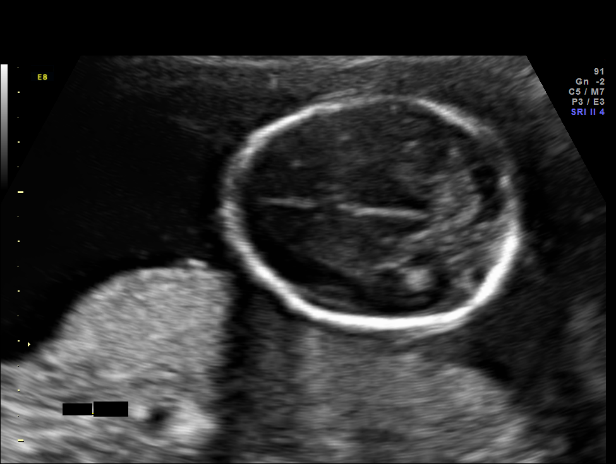
[im 79/112]
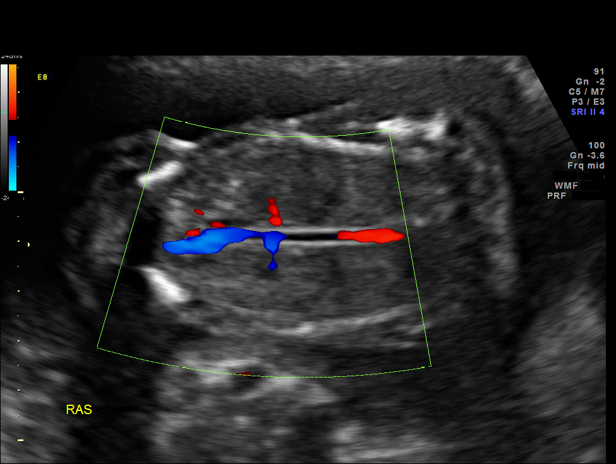
[im 87/112]
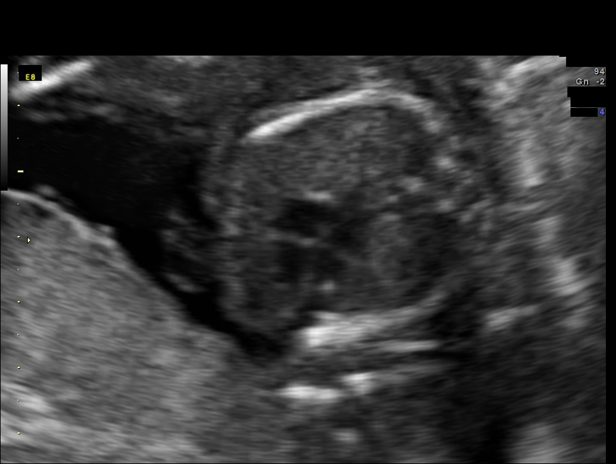
[im 95/112]
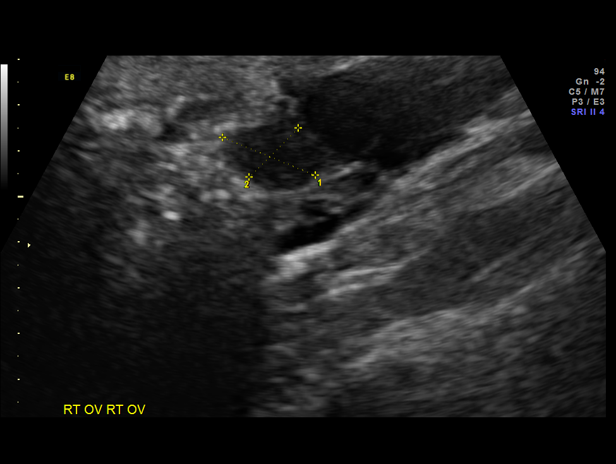
[im 103/112]
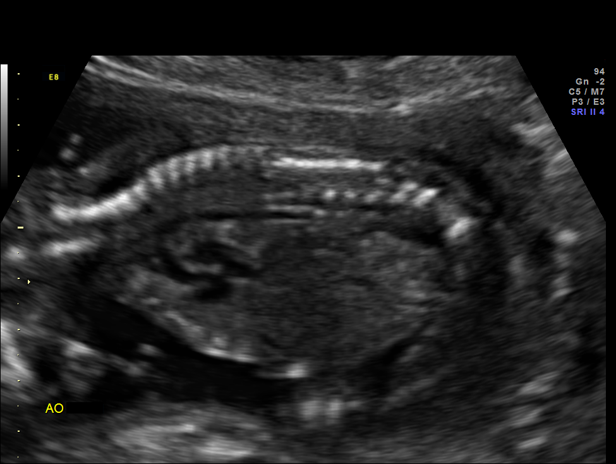
[im 112/112]
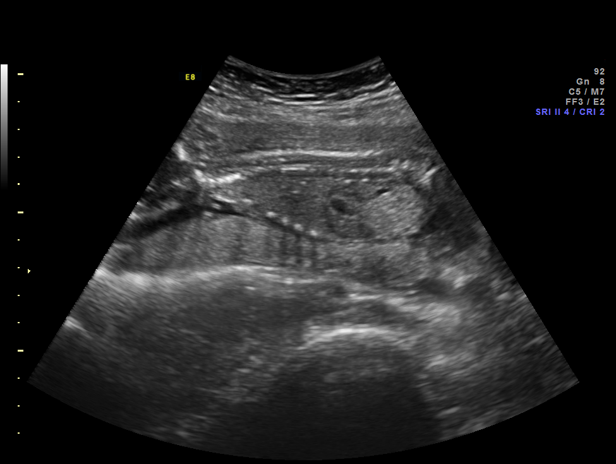

[15 of 28 positions shown; findings below may reference images not displayed]

OBSTETRICS REPORT
                      (Signed Final [DATE] [DATE])

Service(s) Provided

 US OB DETAIL + 14 WK                                  76811.0
Indications

 Advanced maternal age multigravida 35+, second        [Q1]
 trimester
 Poor obstetric history: Previous preeclampsia /       [Q1]
 eclampsia/gestational HTN
 Poor obstetric history: Previous IUFD (stillbirth)    [Q1]
 19 weeks gestation of pregnancy
Fetal Evaluation

 Num Of Fetuses:    1
 Fetal Heart Rate:  144                          bpm
 Cardiac Activity:  Observed
 Presentation:      Cephalic
 Placenta:          Posterior, above cervical
                    os
 P. Cord            Visualized, central
 Insertion:

 Amniotic Fluid
 AFI FV:      Subjectively within normal limits
                                             Larg Pckt:   6.42.  cm
Biometry

 BPD:     44.9  mm     G. Age:  19w 4d                CI:         72.7   70 - 86
 OFD:     61.8  mm                                    FL/HC:      16.6   16.1 -

 HC:     172.5  mm     G. Age:  19w 6d       79  %    HC/AC:      1.15   1.09 -

 AC:     150.2  mm     G. Age:  20w 2d       83  %    FL/BPD:
 FL:      28.7  mm     G. Age:  18w 6d       37  %    FL/AC:      19.1   20 - 24
 HUM:     29.2  mm     G. Age:  19w 4d       64  %

 Est. FW:     303  gm    0 lb 11 oz      55  %
Gestational Age

 LMP:           19w 0d        Date:  [DATE]                 EDD:   [DATE]
 U/S Today:     19w 5d                                        EDD:   [DATE]
 Best:          19w 0d     Det. By:  LMP  ([DATE])          EDD:   [DATE]
Anatomy

 Cranium:          Appears normal         Aortic Arch:      Appears normal
 Fetal Cavum:      Appears normal         Ductal Arch:      Not well visualized
 Ventricles:       Appears normal         Diaphragm:        Not well visualized
 Choroid Plexus:   Appears normal         Stomach:          Appears normal, left
                                                            sided
 Cerebellum:       Appears normal         Abdomen:          Appears normal
 Posterior Fossa:  Appears normal         Abdominal Wall:   Appears nml (cord
                                                            insert, abd wall)
 Nuchal Fold:      Not well visualized    Cord Vessels:     2 vessel cord,
                                                            absent right RUJAE
                                                            RUJAE
 Face:             Orbits appear          Kidneys:          Appear normal
                   normal
 Lips:             Appears normal         Bladder:          Appears normal
 Heart:            Echogenic focus        Spine:            Appears normal
                   in LV
 RVOT:             Not well visualized    Lower             Appears normal
                                          Extremities:
 LVOT:             Appears normal         Upper             Appears normal
                                          Extremities:

 Other:  Fetus appears to be a male. Heels visualized. Technically difficult due
         to fetal position.
Cervix Uterus Adnexa

 Cervical Length:    3.6      cm

 Cervix:       Normal appearance by transabdominal scan.
 Uterus:       No abnormality visualized.
 Cul De Sac:   No free fluid seen.
 Left Ovary:    Not visualized.
 Right Ovary:   Within normal limits.
 Adnexa:     No abnormality visualized.
Comments

 The patient's fetal anatomic survey is not complete.  An
 echogenic focus was seen in the left cardiac ventricle.  In
 addition, a single umbilical artery was identified on today's
 ultrasound.  Taken together these findings are associated
 with an increased risk of Downs syndrome.  Her risk of
 Downs on her second trimester screen was 1 in 632.  This is
 increased to 1 in 166 due to the presence of the echogenic
 intracardiac focus and the single umbilical artery.  I discussed
 the nature of these findings and their implications with Ms.
 RUJAE and her husband.  She does not wish to pursue any
 further genetic testing and declined genetic counseling.

 Several aspects of the fetal anatomy could not be seen on
 today's ultrasound including the fetal profile.  We were unable
 to completely visualize the fetal cardiac anatomy or
 determine if the fetal nasal bone was present.  If the nasal
 bone was absent or a fetal cardiac defect be identified, the
 risk of Downs syndrome would be dramatically increased
 above 1 in 166.  I offered to perform a follow up ultrasound
 here in the [facility] to attempt to visualize the remaining
 aspects of the fetal anatomy.  However, she prefers to have
 that study performed in her primary OBs office.  As a single
 umbilical artery is associated with an increased risk of
 congenital heart defects, complete visualization of the fetal
 cardiac anatomy is recommended.  A fetal echo could be
 considered to accomplish this.  In addition, if an absent nasal
 bone is confirmed genetic counseling should again be
 considered.
 Ms. RUJAE case was discussed with Dr. RUJAE today.
Impression

 Single living intrauterine pregnancy at 19 weeks 0 days.
 Appropriate fetal growth (55%).
 Normal amniotic fluid volume.
 Echogenic intracardiac focus.
 Single umbilical artery.
 See comments.
Recommendations

 See comments.

 questions or concerns.
                RUJAE

## 2014-12-23 LAB — OB RESULTS CONSOLE GBS: STREP GROUP B AG: POSITIVE

## 2015-01-17 ENCOUNTER — Inpatient Hospital Stay (HOSPITAL_COMMUNITY)
Admission: AD | Admit: 2015-01-17 | Discharge: 2015-01-17 | Disposition: A | Discharge status: Home / Self Care | Payer: BLUE CROSS/BLUE SHIELD | Source: Ambulatory Visit | Attending: Obstetrics & Gynecology | Admitting: Obstetrics & Gynecology

## 2015-01-17 ENCOUNTER — Encounter (HOSPITAL_COMMUNITY): Payer: Self-pay | Admitting: *Deleted

## 2015-01-17 DIAGNOSIS — Z3A39 39 weeks gestation of pregnancy: Secondary | ICD-10-CM | POA: Diagnosis not present

## 2015-01-17 DIAGNOSIS — O9989 Other specified diseases and conditions complicating pregnancy, childbirth and the puerperium: Secondary | ICD-10-CM | POA: Diagnosis present

## 2015-01-17 MED ORDER — LACTATED RINGERS IV BOLUS (SEPSIS)
1000.0000 mL | Freq: Once | INTRAVENOUS | Status: AC
  Administered 2015-01-17: 1000 mL via INTRAVENOUS

## 2015-01-17 NOTE — MAU Note (Signed)
Back pain that radiates to front lower abdomen Denies bright red vaginal bleeding.  Positivefetal movement Denies SROM/LOF Denies any infections/complications of pregnancy  GBS positive per patient

## 2015-01-18 ENCOUNTER — Telehealth (HOSPITAL_COMMUNITY): Payer: Self-pay | Admitting: *Deleted

## 2015-01-18 NOTE — Telephone Encounter (Signed)
Preadmission screen  

## 2015-01-20 NOTE — H&P (Signed)
HPI: 39 y/o [id number] @ [redacted]w[redacted]d estimated gestational age (as dated by LMP c/w 20 week ultrasound) presents for late term pregnancy IOL.   no Leaking of Fluid,   no Vaginal Bleeding,   irregular Uterine Contractions,  + Fetal Movement.  ROS: no HA, no epigastric pain, no visual changes.    Pregnancy complicated by: 1) Advanced maternal age  -declined first trimester screening, tetra negative.  20wk scan performed with MFM 2) Two vessel cord and intracardiac focus, declined further testing  - followed by antepartum fetal surveillance and ultrasound  -last [place] @ [redacted]w[redacted]d- vertex/post/AFI wnl, EFW: 7#6oz (66%) 3) GBS positive  -PCN in labor    Prenatal Transfer Tool  Maternal Diabetes: No Genetic Screening: Normal Maternal Ultrasounds/Referrals: Abnormal:  Findings:   Other:intracardia focus and 2 vessel cord Fetal Ultrasounds or other Referrals:  Referred to Materal Fetal Medicine  Maternal Substance Abuse:  No Significant Maternal Medications:  Meds include: Other: Ambien prn Significant Maternal Lab Results: Lab values include: Group B Strep positive   PNL:  GBS positive, Rub Immune, Hep B neg, RPR NR, HIV neg, GC/C neg, glucola:normal Hgb: 11.2 Blood type: A positive, antibody negative  OBHx:  -FTNSVD x 3, 6#4-6#10oz, uncomplicated except for first delivery complicated by preeclampsia -IUFD @ 21wk -SAB x1 PMHx:  none Meds:  PNV, Ambien prn Allergy:  No Known Allergies SurgHx: none SocHx:   no Tobacco, no  EtOH, no Illicit Drugs  O: LMP 04/14/2014  Vitals to be performed on arrival.  Below examination performed in office 01/16/15: Gen. AAOx3, NAD CV.  RRR  No murmur.  Resp. CTAB, no wheeze or crackles. Abd. Gravid,  no tenderness,  no rigidity,  no guarding Extr.  no edema B/L , no calf tenderness FHT: 150 baseline, moderate variability, + accels,  no decels Toco: irregular SVE: 2/60/-3  BSUS: VTX by exam  Labs: see orders  A/P:  39 y.o. [id number] @ [redacted]w[redacted]d EGA who presents  for IOL for late term pregnancy -FWB:  Reassuring in office on 01/16/15, plan for continuous monitoring -Labor: plan for IOL with Pitocin -GBS: positive, pt to be started on PCN prophylaxis -Pain management: epidural upon request -CBC, T&S to be obtained  Myna Hidalgo, DO [phone] (pager) [phone] (office)

## 2015-01-21 ENCOUNTER — Inpatient Hospital Stay (HOSPITAL_COMMUNITY): Payer: BLUE CROSS/BLUE SHIELD | Admitting: Anesthesiology

## 2015-01-21 ENCOUNTER — Encounter (HOSPITAL_COMMUNITY): Payer: Self-pay

## 2015-01-21 ENCOUNTER — Inpatient Hospital Stay (HOSPITAL_COMMUNITY)
Admission: AD | Admit: 2015-01-21 | Discharge: 2015-01-23 | DRG: 775 | Disposition: A | Discharge status: Home / Self Care | Payer: BLUE CROSS/BLUE SHIELD | Source: Ambulatory Visit | Attending: Obstetrics and Gynecology | Admitting: Obstetrics and Gynecology

## 2015-01-21 DIAGNOSIS — O09523 Supervision of elderly multigravida, third trimester: Secondary | ICD-10-CM

## 2015-01-21 DIAGNOSIS — IMO0001 Reserved for inherently not codable concepts without codable children: Secondary | ICD-10-CM

## 2015-01-21 DIAGNOSIS — O99824 Streptococcus B carrier state complicating childbirth: Secondary | ICD-10-CM | POA: Diagnosis present

## 2015-01-21 DIAGNOSIS — Z3A40 40 weeks gestation of pregnancy: Secondary | ICD-10-CM | POA: Diagnosis present

## 2015-01-21 LAB — CBC
HCT: 33 % — ABNORMAL LOW (ref 36.0–46.0)
HEMOGLOBIN: 10.9 g/dL — AB (ref 12.0–15.0)
MCH: 26.2 pg (ref 26.0–34.0)
MCHC: 33 g/dL (ref 30.0–36.0)
MCV: 79.3 fL (ref 78.0–100.0)
PLATELETS: 250 10*3/uL (ref 150–400)
RBC: 4.16 MIL/uL (ref 3.87–5.11)
RDW: 16 % — ABNORMAL HIGH (ref 11.5–15.5)
WBC: 12.2 10*3/uL — ABNORMAL HIGH (ref 4.0–10.5)

## 2015-01-21 LAB — TYPE AND SCREEN
ABO/RH(D): A POS
ANTIBODY SCREEN: NEGATIVE

## 2015-01-21 MED ORDER — OXYTOCIN 40 UNITS IN LACTATED RINGERS INFUSION - SIMPLE MED
62.5000 mL/h | INTRAVENOUS | Status: DC
  Filled 2015-01-21: qty 1000

## 2015-01-21 MED ORDER — LIDOCAINE HCL (PF) 1 % IJ SOLN
30.0000 mL | INTRAMUSCULAR | Status: DC | PRN
  Filled 2015-01-21: qty 30

## 2015-01-21 MED ORDER — BUTORPHANOL TARTRATE 1 MG/ML IJ SOLN: 1.0000 mg | INTRAMUSCULAR | Status: DC | PRN

## 2015-01-21 MED ORDER — LACTATED RINGERS IV SOLN
INTRAVENOUS | Status: DC
  Administered 2015-01-21: via INTRAUTERINE

## 2015-01-21 MED ORDER — ONDANSETRON HCL 4 MG/2ML IJ SOLN: 4.0000 mg | Freq: Four times a day (QID) | INTRAMUSCULAR | Status: DC | PRN

## 2015-01-21 MED ORDER — OXYTOCIN BOLUS FROM INFUSION: 500.0000 mL | INTRAVENOUS | Status: DC

## 2015-01-21 MED ORDER — BUPIVACAINE HCL (PF) 0.25 % IJ SOLN
INTRAMUSCULAR | Status: DC | PRN
  Administered 2015-01-21 (×2): 4 mL

## 2015-01-21 MED ORDER — PENICILLIN G POTASSIUM 5000000 UNITS IJ SOLR
2.5000 10*6.[IU] | INTRAVENOUS | Status: DC
  Filled 2015-01-21 (×4): qty 2.5

## 2015-01-21 MED ORDER — FENTANYL 2.5 MCG/ML BUPIVACAINE 1/10 % EPIDURAL INFUSION (WH - ANES)
14.0000 mL/h | INTRAMUSCULAR | Status: DC | PRN
  Administered 2015-01-21 (×2): 14 mL/h via EPIDURAL
  Filled 2015-01-21: qty 125

## 2015-01-21 MED ORDER — EPHEDRINE 5 MG/ML INJ
10.0000 mg | INTRAVENOUS | Status: DC | PRN
Start: 2015-01-21 — End: 2015-01-22
  Filled 2015-01-21: qty 2

## 2015-01-21 MED ORDER — LACTATED RINGERS IV SOLN
500.0000 mL | INTRAVENOUS | Status: DC | PRN
  Administered 2015-01-21: 1000 mL via INTRAVENOUS

## 2015-01-21 MED ORDER — PENICILLIN G POTASSIUM 5000000 UNITS IJ SOLR
5.0000 10*6.[IU] | Freq: Once | INTRAVENOUS | Status: AC
  Administered 2015-01-21: 5 10*6.[IU] via INTRAVENOUS
  Filled 2015-01-21: qty 5

## 2015-01-21 MED ORDER — DIPHENHYDRAMINE HCL 50 MG/ML IJ SOLN: 12.5000 mg | INTRAMUSCULAR | Status: DC | PRN

## 2015-01-21 MED ORDER — LACTATED RINGERS IV SOLN: INTRAVENOUS | Status: DC

## 2015-01-21 MED ORDER — OXYCODONE-ACETAMINOPHEN 5-325 MG PO TABS: 2.0000 | ORAL_TABLET | ORAL | Status: DC | PRN

## 2015-01-21 MED ORDER — OXYCODONE-ACETAMINOPHEN 5-325 MG PO TABS: 1.0000 | ORAL_TABLET | ORAL | Status: DC | PRN

## 2015-01-21 MED ORDER — ACETAMINOPHEN 325 MG PO TABS: 650.0000 mg | ORAL_TABLET | ORAL | Status: DC | PRN

## 2015-01-21 MED ORDER — LIDOCAINE-EPINEPHRINE (PF) 2 %-1:200000 IJ SOLN
INTRAMUSCULAR | Status: DC | PRN
  Administered 2015-01-21: 5 mL

## 2015-01-21 MED ORDER — PHENYLEPHRINE 40 MCG/ML (10ML) SYRINGE FOR IV PUSH (FOR BLOOD PRESSURE SUPPORT)
80.0000 ug | PREFILLED_SYRINGE | INTRAVENOUS | Status: DC | PRN
  Filled 2015-01-21: qty 2
  Filled 2015-01-21: qty 20

## 2015-01-21 MED ORDER — CITRIC ACID-SODIUM CITRATE 334-500 MG/5ML PO SOLN: 30.0000 mL | ORAL | Status: DC | PRN

## 2015-01-21 NOTE — MAU Note (Signed)
Contractions tonight. Denies LOF. Bloody show since Thursday. Checked in office last week was dilated 2cm. IOL tomorrow morning.

## 2015-01-21 NOTE — MAU Note (Signed)
Report called to [facility] on BS. Will go to 169 in 10 min

## 2015-01-21 NOTE — Progress Notes (Signed)
Holly Stokes is a 39 y.o. [phone] at [redacted]w[redacted]d  Subjective:  pt desires an epidural however is not ready for the epidural at [facility] time   Objective: BP 131/80 mmHg  Pulse 71  Temp(Src) 98.2 F (36.8 C) (Oral)  Resp 18  Ht 5\' 7"  (1.702 m)  Wt 187 lb 9.6 oz (85.095 kg)  BMI 29.38 kg/m2  LMP 04/14/2014      FHT:  FHR: 150 bpm, variability: moderate,  accelerations:  Present,  decelerations:  Present variable decelerations  UC:   regular, every 4 minutes  minutes SVE:   Dilation: 5.5 Effacement (%): 80 Station: -2 Exam by:: Dr Richardson Dopp  Labs: Lab Results  Component Value Date   WBC 12.2* 01/21/2015   HGB 10.9* 01/21/2015   HCT 33.0* 01/21/2015   MCV 79.3 01/21/2015   PLT 250 01/21/2015    Assessment / Plan: 40 wks and 2 days in active labor  PCN for gbs prophylaxis .Marland Kitchen  Epidural for pain management upon request.   Holly Stokes,Holly J. 01/21/2015, 10:14 PM

## 2015-01-21 NOTE — Anesthesia Preprocedure Evaluation (Signed)

## 2015-01-21 NOTE — H&P (Signed)
HPI: 39 y/o [id number] @ [redacted]w[redacted]d estimated gestational age (as dated by LMP c/w 20 week ultrasound) presents  Complaining of contractions  Every 3-5 ,minutes .Marland Kitchen Cervix 4.5 cm on arrival to MAU.     no Leaking of Fluid, no Vaginal Bleeding, irregular Uterine Contractions, + Fetal Movement.  ROS: no HA, no epigastric pain, no visual changes.   Pregnancy complicated by: 1) Advanced maternal age -declined first trimester screening, tetra negative. 20wk scan performed with MFM 2) Two vessel cord and intracardiac focus, declined further testing - followed by antepartum fetal surveillance and ultrasound -last [place] @ [redacted]w[redacted]d- vertex/post/AFI wnl, EFW: 7#6oz (66%) 3) GBS positive -PCN in labor    Prenatal Transfer Tool  Maternal Diabetes: No Genetic Screening: Normal Maternal Ultrasounds/Referrals: Abnormal: Findings: Other:intracardia focus and 2 vessel cord Fetal Ultrasounds or other Referrals: Referred to Materal Fetal Medicine  Maternal Substance Abuse: No Significant Maternal Medications: Meds include: Other: Ambien prn Significant Maternal Lab Results: Lab values include: Group B Strep positive   PNL: GBS positive, Rub Immune, Hep B neg, RPR NR, HIV neg, GC/C neg, glucola:normal Hgb: 11.2 Blood type: A positive, antibody negative  OBHx:  -FTNSVD x 3, 6#4-6#10oz, uncomplicated except for first delivery complicated by preeclampsia -IUFD @ 21wk -SAB x1 PMHx: none Meds: PNV, Ambien prn Allergy: No Known Allergies SurgHx: none SocHx: no Tobacco, no EtOH, no Illicit Drugs  O: LMP 04/14/2014  Vitals : 98.2 HR 71 Resp 18 BP 131/80 weight 187 lbs  Gen. AAOx3, NAD CV. RRR No murmur.  Resp. CTAB, no wheeze or crackles. Abd. Gravid, no tenderness, no rigidity, no guarding Extr. no edema B/L , no calf tenderness FHT: 150 baseline, moderate variability, + accels, no decels Toco: irregular SVE: 4.5 cm /80 by  nurse exam in mau    Labs: Hgb 10.9 Platelets 250  A/P: 39 y.o. [id number] @ [redacted]w[redacted]d EGA Active labor  Epidural for pain management per patient request Penicillin for gbs prophylaxis  Anticipate svd.

## 2015-01-21 NOTE — Anesthesia Procedure Notes (Signed)
Epidural Patient location during procedure: OB  Staffing Anesthesiologist: Tivon Lemoine, CHRIS Performed by: anesthesiologist   Preanesthetic Checklist Completed: patient identified, surgical consent, pre-op evaluation, timeout performed, IV checked, risks and benefits discussed and monitors and equipment checked  Epidural Patient position: sitting Prep: site prepped and draped and DuraPrep Patient monitoring: heart rate, cardiac monitor, continuous pulse ox and blood pressure Approach: midline Location: L3-L4 Injection technique: LOR saline  Needle:  Needle type: Tuohy  Needle gauge: 17 G Needle length: 9 cm Needle insertion depth: 7 cm Catheter type: closed end flexible Catheter size: 19 Gauge Catheter at skin depth: 13 cm Test dose: 2% lidocaine with Epi 1:200 K and negative  Assessment Events: blood not aspirated, injection not painful, no injection resistance, negative IV test and no paresthesia  Additional Notes H+P and labs checked, risks and benefits discussed with the patient, consent obtained, procedure tolerated well and without complications.  Reason for block:procedure for pain

## 2015-01-22 ENCOUNTER — Encounter (HOSPITAL_COMMUNITY): Payer: Self-pay | Admitting: *Deleted

## 2015-01-22 ENCOUNTER — Inpatient Hospital Stay (HOSPITAL_COMMUNITY)
Admission: RE | Admit: 2015-01-22 | Discharge: 2015-01-22 | Disposition: A | Discharge status: Home / Self Care | Payer: BLUE CROSS/BLUE SHIELD | Source: Ambulatory Visit | Attending: Obstetrics & Gynecology | Admitting: Obstetrics & Gynecology

## 2015-01-22 LAB — CBC
HEMATOCRIT: 28.7 % — AB (ref 36.0–46.0)
Hemoglobin: 9.2 g/dL — ABNORMAL LOW (ref 12.0–15.0)
MCH: 25.6 pg — ABNORMAL LOW (ref 26.0–34.0)
MCHC: 32.1 g/dL (ref 30.0–36.0)
MCV: 79.7 fL (ref 78.0–100.0)
Platelets: 234 10*3/uL (ref 150–400)
RBC: 3.6 MIL/uL — AB (ref 3.87–5.11)
RDW: 16 % — ABNORMAL HIGH (ref 11.5–15.5)
WBC: 14.9 10*3/uL — AB (ref 4.0–10.5)

## 2015-01-22 LAB — ABO/RH: ABO/RH(D): A POS

## 2015-01-22 LAB — HIV ANTIBODY (ROUTINE TESTING W REFLEX): HIV SCREEN 4TH GENERATION: NONREACTIVE

## 2015-01-22 LAB — RPR: RPR Ser Ql: NONREACTIVE

## 2015-01-22 MED ORDER — LANOLIN HYDROUS EX OINT: TOPICAL_OINTMENT | CUTANEOUS | Status: DC | PRN

## 2015-01-22 MED ORDER — PRENATAL MULTIVITAMIN CH
1.0000 | ORAL_TABLET | Freq: Every day | ORAL | Status: DC
  Administered 2015-01-22: 1 via ORAL
  Filled 2015-01-22: qty 1

## 2015-01-22 MED ORDER — METHYLERGONOVINE MALEATE 0.2 MG/ML IJ SOLN: 0.2000 mg | INTRAMUSCULAR | Status: DC | PRN

## 2015-01-22 MED ORDER — METHYLERGONOVINE MALEATE 0.2 MG PO TABS: 0.2000 mg | ORAL_TABLET | ORAL | Status: DC | PRN

## 2015-01-22 MED ORDER — DIBUCAINE 1 % RE OINT: 1.0000 "application " | TOPICAL_OINTMENT | RECTAL | Status: DC | PRN

## 2015-01-22 MED ORDER — ONDANSETRON HCL 4 MG PO TABS: 4.0000 mg | ORAL_TABLET | ORAL | Status: DC | PRN

## 2015-01-22 MED ORDER — SIMETHICONE 80 MG PO CHEW: 80.0000 mg | CHEWABLE_TABLET | ORAL | Status: DC | PRN

## 2015-01-22 MED ORDER — ONDANSETRON HCL 4 MG/2ML IJ SOLN: 4.0000 mg | INTRAMUSCULAR | Status: DC | PRN

## 2015-01-22 MED ORDER — WITCH HAZEL-GLYCERIN EX PADS: 1.0000 "application " | MEDICATED_PAD | CUTANEOUS | Status: DC | PRN

## 2015-01-22 MED ORDER — BENZOCAINE-MENTHOL 20-0.5 % EX AERO
1.0000 "application " | INHALATION_SPRAY | CUTANEOUS | Status: DC | PRN
  Administered 2015-01-22: 1 via TOPICAL
  Filled 2015-01-22: qty 56

## 2015-01-22 MED ORDER — OXYCODONE-ACETAMINOPHEN 5-325 MG PO TABS: 2.0000 | ORAL_TABLET | ORAL | Status: DC | PRN

## 2015-01-22 MED ORDER — OXYCODONE-ACETAMINOPHEN 5-325 MG PO TABS: 1.0000 | ORAL_TABLET | ORAL | Status: DC | PRN

## 2015-01-22 MED ORDER — FERROUS SULFATE 325 (65 FE) MG PO TABS
325.0000 mg | ORAL_TABLET | Freq: Two times a day (BID) | ORAL | Status: DC
  Administered 2015-01-22 – 2015-01-23 (×3): 325 mg via ORAL
  Filled 2015-01-22 (×3): qty 1

## 2015-01-22 MED ORDER — ZOLPIDEM TARTRATE 5 MG PO TABS: 5.0000 mg | ORAL_TABLET | Freq: Every evening | ORAL | Status: DC | PRN

## 2015-01-22 MED ORDER — DIPHENHYDRAMINE HCL 25 MG PO CAPS: 25.0000 mg | ORAL_CAPSULE | Freq: Four times a day (QID) | ORAL | Status: DC | PRN

## 2015-01-22 MED ORDER — SENNOSIDES-DOCUSATE SODIUM 8.6-50 MG PO TABS
2.0000 | ORAL_TABLET | ORAL | Status: DC
  Administered 2015-01-23: 2 via ORAL
  Filled 2015-01-22: qty 2

## 2015-01-22 MED ORDER — IBUPROFEN 600 MG PO TABS
600.0000 mg | ORAL_TABLET | Freq: Four times a day (QID) | ORAL | Status: DC
  Administered 2015-01-22 – 2015-01-23 (×5): 600 mg via ORAL
  Filled 2015-01-22 (×6): qty 1

## 2015-01-22 MED ORDER — ACETAMINOPHEN 325 MG PO TABS: 650.0000 mg | ORAL_TABLET | ORAL | Status: DC | PRN

## 2015-01-22 NOTE — Lactation Note (Signed)
This note was copied from the chart of Holly Stokes. Lactation Consultation Note  Patient Name: Holly Margel Talamantes IOMBT'D Date: 01/22/2015 Reason for consult: Follow-up assessment   Follow-up at 15 hours old.  Mom is a P4 with BF experience. Infant has breastfed x8 (15-30 min) since birth 15 hours ago; voids-2; stools-3 since birth.  LS-9 by RN Mom asleep when [facility] visited.  Dad was holding infant and stated mom is breastfeeding well and had no questions.  Encouraged to call if needed.    Consult Status Consult Status: Follow-up Date: 01/23/15 Follow-up type: In-patient    Lendon Ka 01/22/2015, 4:05 PM

## 2015-01-22 NOTE — Lactation Note (Signed)
This note was copied from the chart of Holly Stokes. Lactation Consultation Note Experienced BF mom states she BF 6-8 months. No problems, the youngest is 4 yrs. Old. Mom has everted nipples BF holding baby in cradle hold swaddled in blankets. Encouraged to open front part of blankets to do STS. Mom encouraged to feed baby 8-12 times/24 hours and with feeding cues. Mom encouraged to waken baby for feeds. Educated about newborn behavior. Referred to Baby and Me Book in Breastfeeding section Pg. 22-23 for position options and Proper latch demonstration. Mom hand expressed to show colostrum. Mother informed of post-discharge support and given phone number to the lactation department, including services for phone call assistance; out-patient appointments; and breastfeeding support group. List of other breastfeeding resources in the community given in the handout. Encouraged mother to call for problems or concerns related to breastfeeding. Patient Name: Holly Yarielis Billard ZPHXT'A Date: 01/22/2015 Reason for consult: Initial assessment   Maternal Data Has patient been taught Hand Expression?: Yes Does the patient have breastfeeding experience prior to this delivery?: Yes  Feeding Feeding Type: Breast Fed Length of feed: 30 min  LATCH Score/Interventions Latch: Grasps breast easily, tongue down, lips flanged, rhythmical sucking.  Audible Swallowing: None Intervention(s): Skin to skin;Hand expression Intervention(s): Hand expression;Alternate breast massage  Type of Nipple: Everted at rest and after stimulation  Comfort (Breast/Nipple): Soft / non-tender     Hold (Positioning): No assistance needed to correctly position infant at breast.  LATCH Score: 8  Lactation Tools Discussed/Used     Consult Status Consult Status: Follow-up Date: 01/23/15 Follow-up type: In-patient    Dametri Ozburn, Diamond Nickel 01/22/2015, 4:20 AM

## 2015-01-22 NOTE — Anesthesia Postprocedure Evaluation (Signed)
Anesthesia Post Note  Patient: Holly Stokes  Procedure(s) Performed: * No procedures listed *  Anesthesia type: Epidural  Patient location: Mother/Baby  Post pain: Pain level controlled  Post assessment: Post-op Vital signs reviewed  Last Vitals:  Filed Vitals:   01/22/15 0345  BP: 116/59  Pulse: 56  Temp: 37 C  Resp: 16    Post vital signs: Reviewed  Level of consciousness:alert  Complications: No apparent anesthesia complications

## 2015-01-22 NOTE — Progress Notes (Signed)
Postpartum Note Day # 0  S:  Patient resting comfortable in bed.  Pain controlled.  Tolerating general diet. No flatus, no BM.  Lochia moderate.  Ambulating without difficulty, pt has not yet voided on her own.  She denies n/v/f/c, SOB, or CP.  Pt plans on breastfeeding.  O: Temp:  [98.2 F (36.8 C)-98.6 F (37 C)] 98.6 F (37 C) (06/13 0345) Pulse Rate:  [54-99] 56 (06/13 0345) Resp:  [16-20] 16 (06/13 0345) BP: (94-143)/(49-84) 116/59 mmHg (06/13 0345) Weight:  [85.095 kg (187 lb 9.6 oz)] 85.095 kg (187 lb 9.6 oz) (06/12 2128) Gen: A&Ox3, NAD CV: RRR, no MRG Resp: CTAB Abdomen: soft, NT, ND Uterus: firm, non-tender, below umbilicus Ext: No edema, no calf tenderness bilaterally  Labs:  Recent Labs  01/21/15 2047 01/22/15 0555  HGB 10.9* 9.2*    A/P: Pt is a 39 y.o. [id number] s/p NSVD, PPD #0  - Pain well controlled -GU: UOP adequate, pt feeling ready to void this am -GI: Tolerating general diet -Activity: encouraged sitting up to chair and ambulation as tolerated -Prophylaxis: early ambulation -Labs: stable as above, will start on iron daily -Plan for baby boy circ later today  DISPO:  Continue with routine postpartum care, plan for discharge home tomorrow.  Myna Hidalgo, DO [phone] (pager) [phone] (office)

## 2015-01-23 MED ORDER — FERROUS SULFATE 325 (65 FE) MG PO TABS: 325.0000 mg | ORAL_TABLET | Freq: Two times a day (BID) | ORAL | Status: DC

## 2015-01-23 MED ORDER — IBUPROFEN 600 MG PO TABS: 600.0000 mg | ORAL_TABLET | Freq: Four times a day (QID) | ORAL | Status: DC

## 2015-01-23 NOTE — Discharge Instructions (Signed)
Vaginal Delivery, Care After Refer to this sheet in the next few weeks. These discharge instructions provide you with information on caring for yourself after delivery. Your caregiver may also give you specific instructions. Your treatment has been planned according to the most current medical practices available, but problems sometimes occur. Call your caregiver if you have any problems or questions after you go home. HOME CARE INSTRUCTIONS  Take over-the-counter or prescription medicines only as directed by your caregiver or pharmacist.  The iron may cause constipation, be sure to use stool softener or increase fiber as needed.  Do not drink alcohol, especially if you are breastfeeding or taking medicine to relieve pain.  Do not chew or smoke tobacco.  Do not use illegal drugs.  Continue to use good perineal care. Good perineal care includes:  Wiping your perineum from front to back.  Keeping your perineum clean.  Do not use tampons or douche until your caregiver says it is okay.  Shower, wash your hair, and take tub baths as directed by your caregiver.  Wear a well-fitting bra that provides breast support.  Eat healthy foods.  Drink enough fluids to keep your urine clear or pale yellow.  Eat high-fiber foods such as whole grain cereals and breads, brown rice, beans, and fresh fruits and vegetables every day. These foods may help prevent or relieve constipation.  Follow your caregiver's recommendations regarding resumption of activities such as climbing stairs, driving, lifting, exercising, or traveling.  Talk to your caregiver about resuming sexual activities. Resumption of sexual activities is dependent upon your risk of infection, your rate of healing, and your comfort and desire to resume sexual activity.  Try to have someone help you with your household activities and your newborn for at least a few days after you leave the hospital.  Rest as much as possible. Try to rest  or take a nap when your newborn is sleeping.  Increase your activities gradually.  Keep all of your scheduled postpartum appointments. It is very important to keep your scheduled follow-up appointments. At these appointments, your caregiver will be checking to make sure that you are healing physically and emotionally. SEEK MEDICAL CARE IF:   You are passing large clots from your vagina. Save any clots to show your caregiver.  You have a foul smelling discharge from your vagina.  You have trouble urinating.  You are urinating frequently.  You have pain when you urinate.  You have a change in your bowel movements.  You have increasing redness, pain, or swelling near your vaginal incision (episiotomy) or vaginal tear.  You have pus draining from your episiotomy or vaginal tear.  Your episiotomy or vaginal tear is separating.  You have painful, hard, or reddened breasts.  You have a severe headache.  You have blurred vision or see spots.  You feel sad or depressed.  You have thoughts of hurting yourself or your newborn.  You have questions about your care, the care of your newborn, or medicines.  You are dizzy or light-headed.  You have a rash.  You have nausea or vomiting.  You were breastfeeding and have not had a menstrual period within 12 weeks after you stopped breastfeeding.  You are not breastfeeding and have not had a menstrual period by the 12th week after delivery.  You have a fever. SEEK IMMEDIATE MEDICAL CARE IF:   You have persistent pain.  You have chest pain.  You have shortness of breath.  You faint.  You have  leg pain.  You have stomach pain.  Your vaginal bleeding saturates two or more sanitary pads in 1 hour. MAKE SURE YOU:   Understand these instructions.  Will watch your condition.  Will get help right away if you are not doing well or get worse. Document Released: 07/25/2000 Document Revised: 12/12/2013 Document Reviewed:  03/24/2012 [facility] Patient Information 2015 [place], [place]. This information is not intended to replace advice given to you by your health care provider. Make sure you discuss any questions you have with your health care provider.

## 2015-01-23 NOTE — Progress Notes (Signed)
Postpartum Note Day # 1  S:  Patient resting comfortable in bed.  Pain controlled.  Tolerating general diet. + flatus, no BM.  Lochia moderate.  Ambulating and voiding without difficulty.  She denies n/v/f/c, SOB, or CP.  Pt  breastfeeding.  O: Temp:  [97.6 F (36.4 C)-98.4 F (36.9 C)] 98.4 F (36.9 C) (06/14 0602) Pulse Rate:  [60-68] 60 (06/14 0602) Resp:  [18] 18 (06/14 0602) BP: (106-130)/(59-78) 130/78 mmHg (06/14 0602) SpO2:  [98 %] 98 % (06/14 0602) Gen: A&Ox3, NAD CV: RRR, no MRG Resp: CTAB Abdomen: soft, NT, ND Uterus: firm, non-tender, below umbilicus Ext: No edema, no calf tenderness bilaterally  Labs:   Recent Labs  01/21/15 2047 01/22/15 0555  HGB 10.9* 9.2*    A/P: Pt is a 39 y.o. [id number] s/p NSVD, PPD #1  - Pain well controlled -GU: UOP adequate, pt feeling ready to void this am -GI: Tolerating general diet -Activity: encouraged sitting up to chair and ambulation as tolerated -Prophylaxis: early ambulation -Labs: stable as above, will start on iron daily -Baby boy circ completed  DISPO:  Plan for discharge home today  Myna Hidalgo, DO [phone] (pager) [phone] (office)

## 2015-01-26 NOTE — Discharge Summary (Signed)
Obstetric Discharge Summary Reason for Admission: onset of labor Prenatal Procedures: NST and ultrasound Intrapartum Procedures: spontaneous vaginal delivery and GBS prophylaxis Postpartum Procedures: none Complications-Operative and Postpartum: 1st degree perineal laceration HEMOGLOBIN  Date Value Ref Range Status  01/22/2015 9.2* 12.0 - 15.0 g/dL Final   HCT  Date Value Ref Range Status  01/22/2015 28.7* 36.0 - 46.0 % Final   Hospital Course: 39 y/o [id number] @ [redacted]w[redacted]d estimated gestational age (as dated by LMP c/w 20 week ultrasound) presents Complaining of contractions Every 3-5 ,minutes.  On arrival, she was noted to be 4.5 cm in MAU.   Pregnancy complicated by: 1) Advanced maternal age -declined first trimester screening, tetra negative. 20wk scan performed with MFM 2) Two vessel cord and intracardiac focus, declined further testing - followed by antepartum fetal surveillance and ultrasound -last [place] @ [redacted]w[redacted]d- vertex/post/AFI wnl, EFW: 7#6oz (66%) 3) GBS positive -PCN in labor She received PCN for GBS prophylaxis and an epidural for pain management.  She progressed expectantly, delivery performed by Dr. Richardson Dopp. NSVD with 1st degree laceration- please see delivery summary for further information.  Her postpartum course was uncomplicated and was discharged home in stable condition on PPD#1.  Physical Exam:  Gen: A&Ox3, NAD CV: RRR, no MRG Resp: CTAB Abdomen: soft, NT, ND Uterus: firm, non-tender, below umbilicus Ext: No edema, no calf tenderness bilaterally  Discharge Diagnoses: Term Pregnancy-delivered  Discharge Information: Date: 01/26/2015 Activity: pelvic rest Diet: routine Medications: PNV and Ibuprofen Condition: stable Instructions: refer to practice specific booklet Discharge to: home Follow-up Information    Follow up with Myna Hidalgo, M, DO In 6 weeks.   Specialty:  Obstetrics and Gynecology   Contact  information:   [address] [place] [address] [phone]       Newborn Data: Live born female  Birth Weight: 7 lb 9.7 oz (3450 g) APGAR: 8, 9  Home with mother.  Myna Hidalgo, M 01/26/2015, 8:03 PM

## 2015-08-08 ENCOUNTER — Encounter (HOSPITAL_COMMUNITY): Payer: Self-pay | Admitting: *Deleted

## 2015-08-08 MED ORDER — CEFAZOLIN SODIUM-DEXTROSE 2-3 GM-% IV SOLR
2.0000 g | INTRAVENOUS | Status: AC
  Administered 2015-08-09: 2 g via INTRAVENOUS
  Filled 2015-08-08: qty 50

## 2015-08-08 MED ORDER — CHLORHEXIDINE GLUCONATE 4 % EX LIQD: 60.0000 mL | Freq: Once | CUTANEOUS | Status: DC

## 2015-08-08 MED ORDER — LACTATED RINGERS IV SOLN
INTRAVENOUS | Status: DC
  Administered 2015-08-09: 12:00:00 via INTRAVENOUS

## 2015-08-09 ENCOUNTER — Ambulatory Visit (HOSPITAL_COMMUNITY): Payer: BLUE CROSS/BLUE SHIELD

## 2015-08-09 ENCOUNTER — Encounter (HOSPITAL_COMMUNITY)
Admission: RE | Disposition: A | Discharge status: Home / Self Care | Payer: Self-pay | Source: Ambulatory Visit | Attending: Orthopedic Surgery

## 2015-08-09 ENCOUNTER — Ambulatory Visit (HOSPITAL_COMMUNITY): Payer: BLUE CROSS/BLUE SHIELD | Admitting: Certified Registered Nurse Anesthetist

## 2015-08-09 ENCOUNTER — Ambulatory Visit (HOSPITAL_COMMUNITY)
Admission: RE | Admit: 2015-08-09 | Discharge: 2015-08-09 | Disposition: A | Discharge status: Home / Self Care | Payer: BLUE CROSS/BLUE SHIELD | Source: Ambulatory Visit | Attending: Orthopedic Surgery | Admitting: Orthopedic Surgery

## 2015-08-09 ENCOUNTER — Encounter (HOSPITAL_COMMUNITY): Payer: Self-pay | Admitting: Certified Registered Nurse Anesthetist

## 2015-08-09 DIAGNOSIS — S82002A Unspecified fracture of left patella, initial encounter for closed fracture: Secondary | ICD-10-CM | POA: Insufficient documentation

## 2015-08-09 DIAGNOSIS — Z419 Encounter for procedure for purposes other than remedying health state, unspecified: Secondary | ICD-10-CM

## 2015-08-09 DIAGNOSIS — W19XXXA Unspecified fall, initial encounter: Secondary | ICD-10-CM | POA: Insufficient documentation

## 2015-08-09 HISTORY — PX: ORIF PATELLA: SHX5033

## 2015-08-09 HISTORY — DX: Headache: R51

## 2015-08-09 HISTORY — DX: Headache, unspecified: R51.9

## 2015-08-09 HISTORY — DX: Iron deficiency: E61.1

## 2015-08-09 HISTORY — DX: Personal history of urinary (tract) infections: Z87.440

## 2015-08-09 LAB — PROTIME-INR
INR: 1.06 (ref 0.00–1.49)
PROTHROMBIN TIME: 14 s (ref 11.6–15.2)

## 2015-08-09 LAB — COMPREHENSIVE METABOLIC PANEL
ALBUMIN: 3.9 g/dL (ref 3.5–5.0)
ALK PHOS: 80 U/L (ref 38–126)
ALT: 16 U/L (ref 14–54)
ANION GAP: 11 (ref 5–15)
AST: 20 U/L (ref 15–41)
BUN: 7 mg/dL (ref 6–20)
CALCIUM: 9.5 mg/dL (ref 8.9–10.3)
CO2: 24 mmol/L (ref 22–32)
Chloride: 105 mmol/L (ref 101–111)
Creatinine, Ser: 0.7 mg/dL (ref 0.44–1.00)
GFR calc non Af Amer: >60 mL/min (ref >60)
GLUCOSE: 101 mg/dL — AB (ref 65–99)
POTASSIUM: 4 mmol/L (ref 3.5–5.1)
SODIUM: 140 mmol/L (ref 135–145)
Total Bilirubin: 0.9 mg/dL (ref 0.3–1.2)
Total Protein: 7.1 g/dL (ref 6.5–8.1)

## 2015-08-09 LAB — CBC WITH DIFFERENTIAL/PLATELET
BASOS PCT: 0 %
Basophils Absolute: 0 10*3/uL (ref 0.0–0.1)
EOS ABS: 0.1 10*3/uL (ref 0.0–0.7)
EOS PCT: 1 %
HCT: 40.7 % (ref 36.0–46.0)
HEMOGLOBIN: 13.4 g/dL (ref 12.0–15.0)
LYMPHS ABS: 1.8 10*3/uL (ref 0.7–4.0)
Lymphocytes Relative: 13 %
MCH: 28.8 pg (ref 26.0–34.0)
MCHC: 32.9 g/dL (ref 30.0–36.0)
MCV: 87.3 fL (ref 78.0–100.0)
MONO ABS: 1.1 10*3/uL — AB (ref 0.1–1.0)
MONOS PCT: 8 %
Neutro Abs: 10.7 10*3/uL — ABNORMAL HIGH (ref 1.7–7.7)
Neutrophils Relative %: 78 %
Platelets: 369 10*3/uL (ref 150–400)
RBC: 4.66 MIL/uL (ref 3.87–5.11)
RDW: 13.2 % (ref 11.5–15.5)
WBC: 13.7 10*3/uL — ABNORMAL HIGH (ref 4.0–10.5)

## 2015-08-09 LAB — HCG, SERUM, QUALITATIVE: Preg, Serum: NEGATIVE

## 2015-08-09 LAB — APTT: aPTT: 31 seconds (ref 24–37)

## 2015-08-09 SURGERY — OPEN REDUCTION INTERNAL FIXATION (ORIF) PATELLA
Anesthesia: Regional | Site: Knee | Laterality: Left

## 2015-08-09 MED ORDER — FENTANYL CITRATE (PF) 100 MCG/2ML IJ SOLN
50.0000 ug | Freq: Once | INTRAMUSCULAR | Status: AC
  Administered 2015-08-09: 50 ug via INTRAVENOUS

## 2015-08-09 MED ORDER — MIDAZOLAM HCL 2 MG/2ML IJ SOLN
INTRAMUSCULAR | Status: AC
  Filled 2015-08-09: qty 2

## 2015-08-09 MED ORDER — DEXAMETHASONE SODIUM PHOSPHATE 4 MG/ML IJ SOLN
INTRAMUSCULAR | Status: DC | PRN
  Administered 2015-08-09: 8 mg via INTRAVENOUS

## 2015-08-09 MED ORDER — 0.9 % SODIUM CHLORIDE (POUR BTL) OPTIME
TOPICAL | Status: DC | PRN
  Administered 2015-08-09: 1000 mL

## 2015-08-09 MED ORDER — FENTANYL CITRATE (PF) 100 MCG/2ML IJ SOLN
INTRAMUSCULAR | Status: DC | PRN
  Administered 2015-08-09 (×2): 25 ug via INTRAVENOUS
  Administered 2015-08-09: 50 ug via INTRAVENOUS

## 2015-08-09 MED ORDER — METHOCARBAMOL 500 MG PO TABS: 500.0000 mg | ORAL_TABLET | Freq: Three times a day (TID) | ORAL | Status: DC | PRN

## 2015-08-09 MED ORDER — FENTANYL CITRATE (PF) 250 MCG/5ML IJ SOLN
INTRAMUSCULAR | Status: AC
  Filled 2015-08-09: qty 5

## 2015-08-09 MED ORDER — LIDOCAINE HCL (CARDIAC) 20 MG/ML IV SOLN
INTRAVENOUS | Status: AC
Start: 2015-08-09 — End: 2015-08-09
  Filled 2015-08-09: qty 10

## 2015-08-09 MED ORDER — OXYCODONE-ACETAMINOPHEN 5-325 MG PO TABS: 1.0000 | ORAL_TABLET | ORAL | Status: DC | PRN

## 2015-08-09 MED ORDER — ONDANSETRON HCL 4 MG/2ML IJ SOLN
INTRAMUSCULAR | Status: AC
  Filled 2015-08-09: qty 2

## 2015-08-09 MED ORDER — LACTATED RINGERS IV SOLN
INTRAVENOUS | Status: DC | PRN
  Administered 2015-08-09 (×2): via INTRAVENOUS

## 2015-08-09 MED ORDER — OXYCODONE HCL 5 MG/5ML PO SOLN: 5.0000 mg | Freq: Once | ORAL | Status: DC | PRN

## 2015-08-09 MED ORDER — PROMETHAZINE HCL 25 MG/ML IJ SOLN: 6.2500 mg | INTRAMUSCULAR | Status: DC | PRN

## 2015-08-09 MED ORDER — PROPOFOL 10 MG/ML IV BOLUS
INTRAVENOUS | Status: DC | PRN
Start: 2015-08-09 — End: 2015-08-09
  Administered 2015-08-09: 170 mg via INTRAVENOUS

## 2015-08-09 MED ORDER — PROPOFOL 10 MG/ML IV BOLUS
INTRAVENOUS | Status: AC
  Filled 2015-08-09: qty 20

## 2015-08-09 MED ORDER — LIDOCAINE HCL (CARDIAC) 20 MG/ML IV SOLN
INTRAVENOUS | Status: DC | PRN
  Administered 2015-08-09: 20 mg via INTRAVENOUS

## 2015-08-09 MED ORDER — DEXAMETHASONE SODIUM PHOSPHATE 4 MG/ML IJ SOLN
INTRAMUSCULAR | Status: AC
  Filled 2015-08-09: qty 2

## 2015-08-09 MED ORDER — BUPIVACAINE HCL (PF) 0.25 % IJ SOLN
INTRAMUSCULAR | Status: DC | PRN
  Administered 2015-08-09: 20 mL

## 2015-08-09 MED ORDER — OXYCODONE HCL 5 MG PO TABS: 5.0000 mg | ORAL_TABLET | Freq: Once | ORAL | Status: DC | PRN

## 2015-08-09 MED ORDER — ONDANSETRON HCL 4 MG/2ML IJ SOLN
INTRAMUSCULAR | Status: DC | PRN
  Administered 2015-08-09: 4 mg via INTRAVENOUS

## 2015-08-09 MED ORDER — FENTANYL CITRATE (PF) 100 MCG/2ML IJ SOLN
INTRAMUSCULAR | Status: AC
  Filled 2015-08-09: qty 2

## 2015-08-09 MED ORDER — BUPIVACAINE HCL (PF) 0.25 % IJ SOLN
INTRAMUSCULAR | Status: AC
  Filled 2015-08-09: qty 30

## 2015-08-09 MED ORDER — KETOROLAC TROMETHAMINE 30 MG/ML IJ SOLN: 30.0000 mg | Freq: Once | INTRAMUSCULAR | Status: DC

## 2015-08-09 MED ORDER — ONDANSETRON HCL 4 MG PO TABS: 4.0000 mg | ORAL_TABLET | Freq: Three times a day (TID) | ORAL | Status: DC | PRN

## 2015-08-09 MED ORDER — BUPIVACAINE-EPINEPHRINE (PF) 0.5% -1:200000 IJ SOLN
INTRAMUSCULAR | Status: DC | PRN
  Administered 2015-08-09: 25 mL via PERINEURAL

## 2015-08-09 MED ORDER — MIDAZOLAM HCL 2 MG/2ML IJ SOLN
1.0000 mg | Freq: Once | INTRAMUSCULAR | Status: AC
  Administered 2015-08-09: 1 mg via INTRAVENOUS

## 2015-08-09 MED ORDER — ROCURONIUM BROMIDE 50 MG/5ML IV SOLN
INTRAVENOUS | Status: AC
  Filled 2015-08-09: qty 1

## 2015-08-09 MED ORDER — HYDROMORPHONE HCL 1 MG/ML IJ SOLN: 0.2500 mg | INTRAMUSCULAR | Status: DC | PRN

## 2015-08-09 SURGICAL SUPPLY — 59 items
.062 K-Wire ×4 IMPLANT
APL SKNCLS STERI-STRIP NONHPOA (GAUZE/BANDAGES/DRESSINGS) ×1
BANDAGE ACE 4X5 VEL STRL LF (GAUZE/BANDAGES/DRESSINGS) ×2 IMPLANT
BANDAGE ELASTIC 4 VELCRO ST LF (GAUZE/BANDAGES/DRESSINGS) ×3 IMPLANT
BANDAGE ELASTIC 6 VELCRO ST LF (GAUZE/BANDAGES/DRESSINGS) ×3 IMPLANT
BENZOIN TINCTURE PRP APPL 2/3 (GAUZE/BANDAGES/DRESSINGS) ×2 IMPLANT
BLADE SURG ROTATE 9660 (MISCELLANEOUS) ×1 IMPLANT
BNDG GAUZE ELAST 4 BULKY (GAUZE/BANDAGES/DRESSINGS) ×6 IMPLANT
CLOSURE WOUND 1/2 X4 (GAUZE/BANDAGES/DRESSINGS) ×1
COVER SURGICAL LIGHT HANDLE (MISCELLANEOUS) ×3 IMPLANT
CUFF TOURNIQUET SINGLE 34IN LL (TOURNIQUET CUFF) ×2 IMPLANT
CUFF TOURNIQUET SINGLE 44IN (TOURNIQUET CUFF) IMPLANT
DRAPE C-ARM 42X72 X-RAY (DRAPES) ×3 IMPLANT
DRAPE ORTHO SPLIT 77X108 STRL (DRAPES) ×6
DRAPE SURG ORHT 6 SPLT 77X108 (DRAPES) ×2 IMPLANT
DRSG ADAPTIC 3X8 NADH LF (GAUZE/BANDAGES/DRESSINGS) ×3 IMPLANT
DRSG PAD ABDOMINAL 8X10 ST (GAUZE/BANDAGES/DRESSINGS) ×3 IMPLANT
DURAPREP 26ML APPLICATOR (WOUND CARE) ×3 IMPLANT
ELECT REM PT RETURN 9FT ADLT (ELECTROSURGICAL) ×3
ELECTRODE REM PT RTRN 9FT ADLT (ELECTROSURGICAL) ×1 IMPLANT
GAUZE SPONGE 4X4 12PLY STRL (GAUZE/BANDAGES/DRESSINGS) ×3 IMPLANT
GLOVE BIO SURGEON STRL SZ7.5 (GLOVE) ×3 IMPLANT
GLOVE BIO SURGEON STRL SZ8 (GLOVE) ×3 IMPLANT
GLOVE EUDERMIC 7 POWDERFREE (GLOVE) ×3 IMPLANT
GLOVE SS BIOGEL STRL SZ 7.5 (GLOVE) ×1 IMPLANT
GLOVE SUPERSENSE BIOGEL SZ 7.5 (GLOVE) ×2
GOWN STRL REUS W/ TWL XL LVL3 (GOWN DISPOSABLE) ×2 IMPLANT
GOWN STRL REUS W/TWL XL LVL3 (GOWN DISPOSABLE) ×6
IMMOBILIZER KNEE 24 THIGH 36 (MISCELLANEOUS) IMPLANT
IMMOBILIZER KNEE 24 UNIV (MISCELLANEOUS) ×3
KIT BASIN OR (CUSTOM PROCEDURE TRAY) ×3 IMPLANT
KIT ROOM TURNOVER OR (KITS) ×3 IMPLANT
MANIFOLD NEPTUNE II (INSTRUMENTS) ×3 IMPLANT
NEEDLE 22X1 1/2 (OR ONLY) (NEEDLE) ×2 IMPLANT
NS IRRIG 1000ML POUR BTL (IV SOLUTION) ×3 IMPLANT
PACK ORTHO EXTREMITY (CUSTOM PROCEDURE TRAY) ×3 IMPLANT
PAD ARMBOARD 7.5X6 YLW CONV (MISCELLANEOUS) ×6 IMPLANT
PAD CAST 4YDX4 CTTN HI CHSV (CAST SUPPLIES) ×2 IMPLANT
PADDING CAST COTTON 4X4 STRL (CAST SUPPLIES) ×3
SPONGE LAP 18X18 X RAY DECT (DISPOSABLE) ×3 IMPLANT
STAPLER VISISTAT 35W (STAPLE) ×3 IMPLANT
STRIP CLOSURE SKIN 1/2X4 (GAUZE/BANDAGES/DRESSINGS) ×1 IMPLANT
SUCTION FRAZIER TIP 10 FR DISP (SUCTIONS) ×3 IMPLANT
SUT MNCRL AB 3-0 PS2 18 (SUTURE) ×2 IMPLANT
SUT MON AB 2-0 CT1 36 (SUTURE) ×2 IMPLANT
SUT STEEL 5 V 56 M (SUTURE) ×1 IMPLANT
SUT STEEL 6MS V (SUTURE) ×2 IMPLANT
SUT VIC AB 0 CT1 27 (SUTURE) ×3
SUT VIC AB 0 CT1 27XBRD ANBCTR (SUTURE) ×1 IMPLANT
SUT VIC AB 2-0 CT1 27 (SUTURE) ×6
SUT VIC AB 2-0 CT1 TAPERPNT 27 (SUTURE) ×2 IMPLANT
SYR CONTROL 10ML LL (SYRINGE) ×2 IMPLANT
TOWEL OR 17X24 6PK STRL BLUE (TOWEL DISPOSABLE) ×3 IMPLANT
TOWEL OR 17X26 10 PK STRL BLUE (TOWEL DISPOSABLE) ×3 IMPLANT
TUBE CONNECTING 12'X1/4 (SUCTIONS) ×1
TUBE CONNECTING 12X1/4 (SUCTIONS) ×2 IMPLANT
UNDERPAD 30X30 INCONTINENT (UNDERPADS AND DIAPERS) ×3 IMPLANT
WATER STERILE IRR 1000ML POUR (IV SOLUTION) ×3 IMPLANT
YANKAUER SUCT BULB TIP NO VENT (SUCTIONS) IMPLANT

## 2015-08-09 NOTE — H&P (Signed)
Holly Stokes    Chief Complaint: displaced left patella fracture HPI: The patient is a 39 y.o. female s/p fall onto flexed left knee with comminuted and displaced left patella fracture  Past Medical History  Diagnosis Date  . Hx: UTI (urinary tract infection)   . Headache     migraines during pregnancy  . Low iron   . Complication of anesthesia     doesn't respond well to epidurals  . PONV (postoperative nausea and vomiting)     had nausea with last epidural    Past Surgical History  Procedure Laterality Date  . No past surgeries      Family History  Problem Relation Age of Onset  . Hypertension Mother   . Prostate cancer Father   . High Cholesterol Father     Social History:  reports that she has never smoked. She has never used smokeless tobacco. She reports that she does not drink alcohol or use illicit drugs.  Allergies: No Known Allergies  Medications Prior to Admission  Medication Sig Dispense Refill  . ibuprofen (ADVIL,MOTRIN) 600 MG tablet Take 1 tablet (600 mg total) by mouth every 6 (six) hours. 30 tablet 0     Physical Exam: left knee with diffuse anterior swelling, skin intact, n/v intact distally, exam as documented in office yesterday.  Vitals  Temp:  [98.3 F (36.8 C)] 98.3 F (36.8 C) (12/29 1122) Pulse Rate:  [90-111] 101 (12/29 1213) Resp:  [10-20] 19 (12/29 1213) BP: (115-134)/(62-76) 115/73 mmHg (12/29 1213) SpO2:  [98 %-100 %] 100 % (12/29 1213) Weight:  [71.668 kg (158 lb)] 71.668 kg (158 lb) (12/29 1122)  Assessment/Plan  Impression: displaced left patella fracture  Plan of Action: Procedure(s): OPEN REDUCTION INTERNAL (ORIF) FIXATION LEFT PATELLA  Levi Crass M [name] 08/09/2015, 12:15 PM Contact # [phone]

## 2015-08-09 NOTE — Anesthesia Preprocedure Evaluation (Addendum)
Anesthesia Evaluation  Patient identified by MRN, date of birth, ID band Patient awake    Reviewed: Allergy & Precautions, NPO status , Patient's Chart, lab work & pertinent test results  History of Anesthesia Complications (+) PONV  Airway Mallampati: I  TM Distance: >3 FB Neck ROM: Full    Dental  (+) Dental Advisory Given   Pulmonary neg pulmonary ROS,    breath sounds clear to auscultation       Cardiovascular negative cardio ROS   Rhythm:Regular Rate:Normal     Neuro/Psych negative neurological ROS     GI/Hepatic negative GI ROS, Neg liver ROS,   Endo/Other  negative endocrine ROS  Renal/GU negative Renal ROS     Musculoskeletal   Abdominal   Peds  Hematology negative hematology ROS (+)   Anesthesia Other Findings   Reproductive/Obstetrics                            Anesthesia Physical Anesthesia Plan  ASA: I  Anesthesia Plan: General and Regional   Post-op Pain Management: GA combined w/ Regional for post-op pain   Induction: Intravenous  Airway Management Planned: LMA  Additional Equipment:   Intra-op Plan:   Post-operative Plan: Extubation in OR  Informed Consent: I have reviewed the patients History and Physical, chart, labs and discussed the procedure including the risks, benefits and alternatives for the proposed anesthesia with the patient or authorized representative who has indicated his/her understanding and acceptance.   Dental advisory given  Plan Discussed with: CRNA  Anesthesia Plan Comments:         Anesthesia Quick Evaluation

## 2015-08-09 NOTE — Anesthesia Procedure Notes (Addendum)
Procedure Name: LMA Insertion Date/Time: 08/09/2015 12:59 PM Performed by: Rise PatienceBELL, Maven Rosander T Pre-anesthesia Checklist: Patient identified, Emergency Drugs available, Suction available and Patient being monitored Patient Re-evaluated:Patient Re-evaluated prior to inductionOxygen Delivery Method: Circle system utilized Preoxygenation: Pre-oxygenation with 100% oxygen Intubation Type: IV induction LMA: LMA inserted LMA Size: 4.0 Number of attempts: 1 Placement Confirmation: positive ETCO2 and breath sounds checked- equal and bilateral Tube secured with: Tape Dental Injury: Teeth and Oropharynx as per pre-operative assessment     Anesthesia Regional Block:  Femoral nerve block  Pre-Anesthetic Checklist: ,, timeout performed, Correct Patient, Correct Site, Correct Laterality, Correct Procedure, Correct Position, site marked, Risks and benefits discussed,  Surgical consent,  Pre-op evaluation,  At surgeon's request and post-op pain management  Laterality: Left  Prep: chloraprep       Needles:  Injection technique: Single-shot  Needle Type: Echogenic Stimulator Needle     Needle Length: 9cm 9 cm Needle Gauge: 21 and 21 G    Additional Needles:  Procedures: ultrasound guided (picture in chart) and nerve stimulator Femoral nerve block  Nerve Stimulator or Paresthesia:  Response: quadraceps contraction, 0.45 mA,   Additional Responses:   Narrative:  Start time: 08/09/2015 12:14 PM End time: 08/09/2015 12:22 PM Injection made incrementally with aspirations every 5 mL.  Performed by: Personally  Anesthesiologist: Marcene DuosFITZGERALD, ROBERT  Additional Notes:

## 2015-08-09 NOTE — Op Note (Signed)
Holly Stokes, Holly Stokes NO.:  [id number]  MEDICAL RECORD NO.:  [id number]  LOCATION:  [facility]                         FACILITY:  [facility]  PHYSICIAN:  Vania Rea. Yancarlos Berthold, M.D.  DATE OF BIRTH:  [birth year]  DATE OF PROCEDURE:  08/09/2015 DATE OF DISCHARGE:  08/09/2015                              OPERATIVE REPORT   PREOPERATIVE DIAGNOSIS:  Comminuted, displaced left patella fracture.  POSTOPERATIVE DIAGNOSIS:  Comminuted, displaced left patella fracture.  PROCEDURE:  Open reduction and internal fixation of the comminuted and displaced left patella fracture.  SURGEON:  Vania Rea. Arkeem Harts, M.D.  Threasa HeadsFrench Ana A. Shuford, P.A.-C.  ANESTHESIA:  General endotracheal as well as a femoral nerve block.  ESTIMATED BLOOD LOSS:  Minimal.  DRAINS:  None.  TOURNIQUET TIME:  Available from the anesthesia record approximately 1 hour.  BRIEF HISTORY:  Holly Stokes is a 39 year old female, who fell onto the anterior aspect of her flexed left knee, last week, has had continued difficulties with pain and localized swelling.  Examination shows the skin to be intact across the anterior aspect of the left knee, but marked swelling in the area.  Plain radiographs were reviewed, which show a comminuted and displaced patella fracture on the left.  Due to the degree of displacement and comminution, she is brought to the operating room at this time for planned open reduction and internal fixation.  Preoperatively, I counseled Holly Stokes regarding treatment options and potential risks versus benefits thereof.  Possible surgical complications were reviewed including bleeding, infection, neurovascular injury, malunion, nonunion, loss of fixation, posttraumatic arthritis and possible need for additional surgery including hardware removal. She understands and accepts and agrees with our plan.  PROCEDURE IN DETAIL:  After undergoing routine preop evaluation, the patient received prophylactic  antibiotics and a femoral nerve block was established in the holding area by the Anesthesia Department.  Placed supine on the operating table, underwent smooth induction of a general endotracheal anesthesia.  Tourniquet was applied to the left thigh. Left leg was sterilely prepped and draped in standard fashion.  Time-out was called.  Leg was exsanguinated with the tourniquet inflated to 350 mmHg.  An anterior midline incision was then made approximately 8 cm in length, centered over the patella.  Skin flaps were elevated and mobilized.  Dissection carried deeply, allowing [place] to expose the anterior surface of the patella and adjacent aspects of the quadriceps and patellar tendons.  We then carefully outlined the fracture line, which had a predominant transverse orientation, but there was comminution on both the medial and lateral margins of the patella as well as a free fragment portion of the anterior cortex of the patella distally.  We carefully used the wood handled elevator to expose the fracture lines and allow [place] to identify the exact bony anatomy of the anterior cortex of the patella.  We then mobilized the fracture site and then removed all interposed soft tissue and clot, and irrigated the fracture site, which again had multiple different segments.  We did gain access into the joint through the fracture site and large hemarthrosis was evacuated.  We then copiously irrigated the entire wound on the fracture site,  all interposed soft tissue was carefully removed.  At this point, under direct visualization, performed a reduction of the patellar fracture and held this temporarily with a large tenaculum.  We then used fluoroscopic imaging to confirm that the articular surface was properly realigned, the overall position was to our satisfaction.  Then placed two 0.062 K-wires from proximal to distal, traversing the patella and proper positioning confirmed fluoroscopically.  I then  passed an 18- gauge stainless steel wire in a figure-of-eight pattern beneath the K- wires proximally and distally and then using a standard tension band technique, was able to create excellent compression across the fracture site, maintaining good overall position and alignment.  We used fluoroscopic imaging to confirm that all hardware was in good position and fracture site was properly aligned into our satisfaction.  I then clipped the extra tension band wire segment and that the years over, placed them in a subcutaneous position.  The K-wires were then clipped, bent 180 degrees and then tamped terminally for proper seating.  Final fluoroscopic images showed good alignment of the fracture site, good position of the hardware.  The wounds were then copiously irrigated. Deep fascial layer was closed with a running #1 Vicryl suture, 2-0 Monocryl was used for the deep and superficial subcu layer, intracuticular 3-0 Monocryl for the skin followed by Steri-Strips.  We did supplement the incision with 20 mL of 0.25% plain Marcaine.  Steri- Strips and dry dressing were applied.  Leg was placed into a thigh-high support stocking and knee immobilizer.  Tourniquet was then let down. The patient was awakened, extubated, and taken to the recovery room in stable condition.  Ralene Batheracy Shuford, PA-C was used as an Geophysicist/field seismologistassistant throughout the case, essential for help with positioning of the patient, positioning of the extremity, manipulation of the fracture, wound closure, and intraoperative decision making.     Vania ReaKevin M. Elease Swarm, M.D.     KMS/MEDQ  D:  08/09/2015  T:  08/09/2015  Job:  [id number]

## 2015-08-09 NOTE — Transfer of Care (Signed)
Immediate Anesthesia Transfer of Care Note  Patient: Holly Littlerara D Stokes  Procedure(s) Performed: Procedure(s): OPEN REDUCTION INTERNAL (ORIF) FIXATION LEFT PATELLA (Left)  Patient Location: PACU  Anesthesia Type:General and Regional  Level of Consciousness: awake, alert  and oriented  Airway & Oxygen Therapy: Patient Spontanous Breathing  Post-op Assessment: Report given to RN, Post -op Vital signs reviewed and stable and Patient moving all extremities X 4  Post vital signs: Reviewed and stable  Last Vitals:  Filed Vitals:   08/09/15 1225 08/09/15 1443  BP:    Pulse: 93   Temp:  36.1 C  Resp: 17     Complications: No apparent anesthesia complications

## 2015-08-09 NOTE — Op Note (Signed)
08/09/2015  2:26 PM  PATIENT:   Holly Stokes  39 y.o. female  PRE-OPERATIVE DIAGNOSIS:  Comminuted, displaced left patella fracture  POST-OPERATIVE DIAGNOSIS:  same  PROCEDURE:  ORIF  SURGEON:  Amiir Heckard, Vania ReaKevin M. M.D.  ASSISTANTS: Shuford pac   ANESTHESIA:   GET + FNB  EBL: min  SPECIMEN:  none  Drains: none  TT: available from the anesthesia record   PATIENT DISPOSITION:  PACU - hemodynamically stable.    PLAN OF CARE: Discharge to home after PACU  Dictation# [id number]   Contact # [phone]

## 2015-08-09 NOTE — Discharge Instructions (Signed)
Do not bend knee OK to bear weight with brace on You can change dressing on Saturday then as needed You can shower on day 5 Follow up in 7-10 days Ice and elevate left leg Crutches for comfort

## 2015-08-10 ENCOUNTER — Encounter (HOSPITAL_COMMUNITY): Payer: Self-pay | Admitting: Orthopedic Surgery

## 2015-08-10 NOTE — Anesthesia Postprocedure Evaluation (Signed)
Anesthesia Post Note  Patient: Holly Stokes  Procedure(s) Performed: Procedure(s) (LRB): OPEN REDUCTION INTERNAL (ORIF) FIXATION LEFT PATELLA (Left)  Patient location during evaluation: PACU Anesthesia Type: General and Regional Level of consciousness: awake and alert Pain management: pain level controlled Vital Signs Assessment: post-procedure vital signs reviewed and stable Respiratory status: spontaneous breathing Cardiovascular status: blood pressure returned to baseline Anesthetic complications: no    Last Vitals:  Filed Vitals:   08/09/15 1514 08/09/15 1515  BP: 119/69   Pulse: 86   Temp:  36.2 C  Resp: 11     Last Pain:  Filed Vitals:   08/09/15 1530  PainSc: 1                  Kennieth RadFitzgerald, Romario Tith E

## 2016-08-11 NOTE — L&D Delivery Note (Signed)
Delivery Note At 10:13 PM a viable female was delivered via Vaginal, Spontaneous Delivery (Presentation: ROA;  ).  APGAR: 8, 9; weight pending  .   Placenta status: spontaneous intact,placenta was manually inspected. .  Cord:  with the following complications: .  Cord pH: n/a  Anesthesia:  Lidocaine for repair Episiotomy: None Lacerations: Perineal;1st degree;Vaginal Suture Repair: 2.0 3.0 chromic Est. Blood Loss (mL):  100  Mom to postpartum.  Baby to Couplet care / Skin to Skin.  Parents desire circumcision.  Consented.  Geryl RankinsVARNADO, Stellarose Cerny 04/17/2017, 10:44 PM

## 2016-10-01 DIAGNOSIS — O26841 Uterine size-date discrepancy, first trimester: Secondary | ICD-10-CM | POA: Diagnosis not present

## 2016-10-03 DIAGNOSIS — Z3480 Encounter for supervision of other normal pregnancy, unspecified trimester: Secondary | ICD-10-CM | POA: Diagnosis not present

## 2016-10-03 LAB — OB RESULTS CONSOLE HIV ANTIBODY (ROUTINE TESTING): HIV: NONREACTIVE

## 2016-10-03 LAB — OB RESULTS CONSOLE RPR: RPR: NONREACTIVE

## 2016-10-03 LAB — OB RESULTS CONSOLE HEPATITIS B SURFACE ANTIGEN: Hepatitis B Surface Ag: NEGATIVE

## 2016-10-03 LAB — OB RESULTS CONSOLE RUBELLA ANTIBODY, IGM: Rubella: IMMUNE

## 2016-10-03 LAB — OB RESULTS CONSOLE GC/CHLAMYDIA
CHLAMYDIA, DNA PROBE: NEGATIVE
GC PROBE AMP, GENITAL: NEGATIVE

## 2016-10-03 LAB — OB RESULTS CONSOLE ABO/RH: RH Type: POSITIVE

## 2016-10-07 ENCOUNTER — Other Ambulatory Visit (HOSPITAL_COMMUNITY)
Admission: RE | Admit: 2016-10-07 | Discharge: 2016-10-07 | Disposition: A | Discharge status: Home / Self Care | Payer: BLUE CROSS/BLUE SHIELD | Source: Ambulatory Visit | Attending: Obstetrics & Gynecology | Admitting: Obstetrics & Gynecology

## 2016-10-07 ENCOUNTER — Other Ambulatory Visit: Payer: Self-pay | Admitting: Obstetrics & Gynecology

## 2016-10-07 DIAGNOSIS — O09522 Supervision of elderly multigravida, second trimester: Secondary | ICD-10-CM | POA: Diagnosis not present

## 2016-10-07 DIAGNOSIS — Z01419 Encounter for gynecological examination (general) (routine) without abnormal findings: Secondary | ICD-10-CM | POA: Insufficient documentation

## 2016-10-07 DIAGNOSIS — Z113 Encounter for screening for infections with a predominantly sexual mode of transmission: Secondary | ICD-10-CM | POA: Insufficient documentation

## 2016-10-09 LAB — CYTOLOGY - PAP
CHLAMYDIA, DNA PROBE: NEGATIVE
DIAGNOSIS: NEGATIVE
Neisseria Gonorrhea: NEGATIVE

## 2016-12-02 DIAGNOSIS — Z36 Encounter for antenatal screening for chromosomal anomalies: Secondary | ICD-10-CM | POA: Diagnosis not present

## 2016-12-02 DIAGNOSIS — O09522 Supervision of elderly multigravida, second trimester: Secondary | ICD-10-CM | POA: Diagnosis not present

## 2016-12-02 DIAGNOSIS — Z3482 Encounter for supervision of other normal pregnancy, second trimester: Secondary | ICD-10-CM | POA: Diagnosis not present

## 2017-01-21 DIAGNOSIS — O09522 Supervision of elderly multigravida, second trimester: Secondary | ICD-10-CM | POA: Diagnosis not present

## 2017-01-30 DIAGNOSIS — Z23 Encounter for immunization: Secondary | ICD-10-CM | POA: Diagnosis not present

## 2017-02-26 DIAGNOSIS — O26843 Uterine size-date discrepancy, third trimester: Secondary | ICD-10-CM | POA: Diagnosis not present

## 2017-02-26 DIAGNOSIS — O09523 Supervision of elderly multigravida, third trimester: Secondary | ICD-10-CM | POA: Diagnosis not present

## 2017-03-24 DIAGNOSIS — O09523 Supervision of elderly multigravida, third trimester: Secondary | ICD-10-CM | POA: Diagnosis not present

## 2017-03-26 LAB — OB RESULTS CONSOLE GBS: GBS: NEGATIVE

## 2017-04-15 DIAGNOSIS — O368130 Decreased fetal movements, third trimester, not applicable or unspecified: Secondary | ICD-10-CM | POA: Diagnosis not present

## 2017-04-17 ENCOUNTER — Encounter (HOSPITAL_COMMUNITY): Payer: Self-pay

## 2017-04-17 ENCOUNTER — Inpatient Hospital Stay (HOSPITAL_COMMUNITY)
Admission: RE | Admit: 2017-04-17 | Discharge: 2017-04-18 | DRG: 775 | Disposition: A | Discharge status: Home / Self Care | Payer: BLUE CROSS/BLUE SHIELD | Source: Ambulatory Visit | Attending: Obstetrics & Gynecology | Admitting: Obstetrics & Gynecology

## 2017-04-17 DIAGNOSIS — G4700 Insomnia, unspecified: Secondary | ICD-10-CM | POA: Diagnosis present

## 2017-04-17 DIAGNOSIS — Z3A39 39 weeks gestation of pregnancy: Secondary | ICD-10-CM | POA: Diagnosis not present

## 2017-04-17 DIAGNOSIS — Z3493 Encounter for supervision of normal pregnancy, unspecified, third trimester: Secondary | ICD-10-CM

## 2017-04-17 DIAGNOSIS — O26893 Other specified pregnancy related conditions, third trimester: Principal | ICD-10-CM | POA: Diagnosis present

## 2017-04-17 HISTORY — DX: Unspecified infectious disease: B99.9

## 2017-04-17 LAB — CBC
HCT: 36.7 % (ref 36.0–46.0)
Hemoglobin: 12.2 g/dL (ref 12.0–15.0)
MCH: 27.5 pg (ref 26.0–34.0)
MCHC: 33.2 g/dL (ref 30.0–36.0)
MCV: 82.7 fL (ref 78.0–100.0)
PLATELETS: 252 10*3/uL (ref 150–400)
RBC: 4.44 MIL/uL (ref 3.87–5.11)
RDW: 15.1 % (ref 11.5–15.5)
WBC: 10 10*3/uL (ref 4.0–10.5)

## 2017-04-17 LAB — TYPE AND SCREEN
ABO/RH(D): A POS
Antibody Screen: NEGATIVE

## 2017-04-17 MED ORDER — FENTANYL 2.5 MCG/ML BUPIVACAINE 1/10 % EPIDURAL INFUSION (WH - ANES): 14.0000 mL/h | INTRAMUSCULAR | Status: DC | PRN

## 2017-04-17 MED ORDER — ACETAMINOPHEN 325 MG PO TABS: 650.0000 mg | ORAL_TABLET | ORAL | Status: DC | PRN

## 2017-04-17 MED ORDER — EPHEDRINE 5 MG/ML INJ
10.0000 mg | INTRAVENOUS | Status: DC | PRN
  Filled 2017-04-17: qty 2

## 2017-04-17 MED ORDER — SOD CITRATE-CITRIC ACID 500-334 MG/5ML PO SOLN: 30.0000 mL | ORAL | Status: DC | PRN

## 2017-04-17 MED ORDER — LIDOCAINE HCL (PF) 1 % IJ SOLN
30.0000 mL | INTRAMUSCULAR | Status: DC | PRN
  Administered 2017-04-17: 30 mL via SUBCUTANEOUS
  Filled 2017-04-17: qty 30

## 2017-04-17 MED ORDER — LACTATED RINGERS IV SOLN: 500.0000 mL | INTRAVENOUS | Status: DC | PRN

## 2017-04-17 MED ORDER — TERBUTALINE SULFATE 1 MG/ML IJ SOLN
0.2500 mg | Freq: Once | INTRAMUSCULAR | Status: DC | PRN
  Filled 2017-04-17: qty 1

## 2017-04-17 MED ORDER — ONDANSETRON HCL 4 MG/2ML IJ SOLN: 4.0000 mg | Freq: Four times a day (QID) | INTRAMUSCULAR | Status: DC | PRN

## 2017-04-17 MED ORDER — PHENYLEPHRINE 40 MCG/ML (10ML) SYRINGE FOR IV PUSH (FOR BLOOD PRESSURE SUPPORT)
80.0000 ug | PREFILLED_SYRINGE | INTRAVENOUS | Status: DC | PRN
  Filled 2017-04-17: qty 5

## 2017-04-17 MED ORDER — CARBOPROST TROMETHAMINE 250 MCG/ML IM SOLN
INTRAMUSCULAR | Status: AC
  Filled 2017-04-17: qty 1

## 2017-04-17 MED ORDER — LACTATED RINGERS IV SOLN
INTRAVENOUS | Status: DC
  Administered 2017-04-17 (×2): via INTRAVENOUS

## 2017-04-17 MED ORDER — OXYCODONE-ACETAMINOPHEN 5-325 MG PO TABS: 2.0000 | ORAL_TABLET | ORAL | Status: DC | PRN

## 2017-04-17 MED ORDER — FENTANYL CITRATE (PF) 100 MCG/2ML IJ SOLN
INTRAMUSCULAR | Status: AC
  Filled 2017-04-17: qty 2

## 2017-04-17 MED ORDER — LACTATED RINGERS IV SOLN: 500.0000 mL | Freq: Once | INTRAVENOUS | Status: DC

## 2017-04-17 MED ORDER — FENTANYL CITRATE (PF) 100 MCG/2ML IJ SOLN
100.0000 ug | INTRAMUSCULAR | Status: DC | PRN
  Administered 2017-04-17: 100 ug via INTRAVENOUS

## 2017-04-17 MED ORDER — IBUPROFEN 600 MG PO TABS
600.0000 mg | ORAL_TABLET | Freq: Four times a day (QID) | ORAL | Status: DC
  Administered 2017-04-18 (×4): 600 mg via ORAL
  Filled 2017-04-17 (×5): qty 1

## 2017-04-17 MED ORDER — OXYTOCIN 40 UNITS IN LACTATED RINGERS INFUSION - SIMPLE MED
1.0000 m[IU]/min | INTRAVENOUS | Status: DC
  Administered 2017-04-17: 2 m[IU]/min via INTRAVENOUS
  Filled 2017-04-17: qty 1000

## 2017-04-17 MED ORDER — OXYCODONE-ACETAMINOPHEN 5-325 MG PO TABS: 1.0000 | ORAL_TABLET | ORAL | Status: DC | PRN

## 2017-04-17 MED ORDER — OXYTOCIN 40 UNITS IN LACTATED RINGERS INFUSION - SIMPLE MED: 2.5000 [IU]/h | INTRAVENOUS | Status: DC

## 2017-04-17 MED ORDER — OXYTOCIN BOLUS FROM INFUSION
500.0000 mL | Freq: Once | INTRAVENOUS | Status: AC
  Administered 2017-04-17: 500 mL via INTRAVENOUS

## 2017-04-17 MED ORDER — DIPHENHYDRAMINE HCL 50 MG/ML IJ SOLN: 12.5000 mg | INTRAMUSCULAR | Status: DC | PRN

## 2017-04-17 NOTE — Progress Notes (Signed)
In to check on pt. RN at bedside performing SVE. Pt tearful.  Requests pain medication. Cervix: 5/90/high per RN. Fetal tracing reassuring. Pt agreeable to Nitrous oxide then Epidural if desired.

## 2017-04-17 NOTE — Progress Notes (Signed)
OB PN:  S: Pt starting to feel more painful contractions  O: BP 128/73   Pulse 83   Temp 98.5 F (36.9 C) (Oral)   Resp 20   Ht 5\' 7"  (1.702 m)   Wt 188 lb (85.3 kg)   BMI 29.44 kg/m   FHT: 140bpm, moderate variablity, + accels, no decels Toco: q2-43min SVE: 3/60/-3, AROM clear minimal fluid  A/P: 41 y.o. [id number] @ 7052w4d 1. FWB: Cat. I 2. Labor: continue Pit per protocol Pain: IV or epidural upon request GBS: negative  Holly HidalgoJennifer Tiffiany Beadles, DO [phone] (pager) [phone] (office)

## 2017-04-17 NOTE — Progress Notes (Addendum)
In to introduce myself to patient. Discussed pain management options.  Pt desires to avoid an epidural but will consider Nitrous Oxide.  Declines IV pain medication. S/p AROM without an obvious gush at the time.  Now she is leaking and contractions are getting stronger. Encouraged ambulation and birthing ball. Nitrous oxide ordered.

## 2017-04-17 NOTE — H&P (Signed)
HPI: 41 y/o [id number] @ 4783w4d estimated gestational age (as dated by LMP c/w first trimester ultrasound) presents for scheduled IOL.   no Leaking of Fluid,   no Vaginal Bleeding,   + Uterine Contractions,  + Fetal Movement.  Prenatal care has been provided by Dr. Charlotta Newtonzan  ROS: no HA, no epigastric pain, no visual changes.    Pregnancy complicated by: 1) AMA- declined genetic testing 2) Insomnia- occasional use of Ambien prn   Prenatal Transfer Tool  Maternal Diabetes: No Genetic Screening: Declined Maternal Ultrasounds/Referrals: Normal Fetal Ultrasounds or other Referrals:  None Maternal Substance Abuse:  No Significant Maternal Medications:  Meds include: Other: occasional Ambien Significant Maternal Lab Results: Lab values include: Group B Strep negative   PNL:  GBS neg, Rub Immune, Hep B neg, RPR NR, HIV neg, GC/C neg, glucola:127 Hgb: 12.3 Blood type: A positive, antibody neg  Immunizations: Tdap: 01/30/17 Flu: declined  OBHx: FTNSVD x 4, uncomplicated, 6#4-7#9 Fetal demise around 20wks SAB x 1 first trimester PMHx:  none Meds:  PNV, ambien prn Allergy:  No Known Allergies SurgHx: none SocHx:   no Tobacco, no  EtOH, no Illicit Drugs  O: BP 124/66   Pulse 77   Temp 98.7 F (37.1 C) (Oral)   Resp 20   Ht 5\' 7"  (1.702 m)   Wt 188 lb (85.3 kg)   BMI 29.44 kg/m   Gen. AAOx3, NAD CV.  Regular rate.  Resp. Normal respiratory rate and effort Abd. Gravid,  no tenderness,  no rigidity,  no guarding Extr.  no edema B/L , no calf tenderness, neg Homan's B/L  FHT: 140 baseline, moderate variability, + accels,  no decels Toco: irregular SVE: 2/50/-3              Labs:  Results for orders placed or performed during the hospital encounter of 04/17/17 (from the past 24 hour(s))  Type and screen [facility]     Status: None   Collection Time: 04/17/17 10:36 AM  Result Value Ref Range   ABO/RH(D) A POS    Antibody Screen NEG    Sample  Expiration 04/20/2017   CBC     Status: None   Collection Time: 04/17/17 10:40 AM  Result Value Ref Range   WBC 10.0 4.0 - 10.5 K/uL   RBC 4.44 3.87 - 5.11 MIL/uL   Hemoglobin 12.2 12.0 - 15.0 g/dL   HCT 98.136.7 19.136.0 - 47.846.0 %   MCV 82.7 78.0 - 100.0 fL   MCH 27.5 26.0 - 34.0 pg   MCHC 33.2 30.0 - 36.0 g/dL   RDW 29.515.1 62.111.5 - 30.815.5 %   Platelets 252 150 - 400 K/uL    A/P:  41 y.o. [id number] @ 6383w4d EGA who presents for IOL -FWB:  NICHD Cat I FHTs -Labor: Pit per protocol -GBS: negative - Pain management: IV or epidural upon request  Myna HidalgoJennifer Ollie Esty, DO [phone] (pager) [phone] (office)

## 2017-04-17 NOTE — Anesthesia Pain Management Evaluation Note (Signed)
  [facility] Pain Management Visit Note  Patient: Holly Stokes, 41 y.o., female  "Hello [facility] am [facility]. We have an anesthesia team available at all times to provide care throughout the hospital, including epidural management and anesthesia for C-section. I don't know your plan for the delivery whether it a natural birth, water birth, IV sedation, nitrous supplementation, doula or epidural, but we want to meet your pain goals."   1.Was your pain managed to your expectations on prior hospitalizations?   Yes   2.What is your expectation for pain management during this hospitalization?     Epidural  3.How can we help you reach that goal? Epidural when ready. Patient is aware of all pain control options, and will determine plan as labor progresses.  Record the patient's initial score and the patient's pain goal.   Pain: 0  Pain Goal: 6 [facility] wants you to be able to say your pain was always managed very well.  Jalena Vanderlinden L 04/17/2017

## 2017-04-18 ENCOUNTER — Encounter (HOSPITAL_COMMUNITY): Payer: Self-pay

## 2017-04-18 LAB — CBC
HEMATOCRIT: 30.3 % — AB (ref 36.0–46.0)
HEMOGLOBIN: 10.1 g/dL — AB (ref 12.0–15.0)
MCH: 27.3 pg (ref 26.0–34.0)
MCHC: 33.3 g/dL (ref 30.0–36.0)
MCV: 81.9 fL (ref 78.0–100.0)
Platelets: 223 10*3/uL (ref 150–400)
RBC: 3.7 MIL/uL — ABNORMAL LOW (ref 3.87–5.11)
RDW: 15.2 % (ref 11.5–15.5)
WBC: 14.2 10*3/uL — ABNORMAL HIGH (ref 4.0–10.5)

## 2017-04-18 LAB — COMPREHENSIVE METABOLIC PANEL
ALBUMIN: 2.4 g/dL — AB (ref 3.5–5.0)
ALT: 13 U/L — ABNORMAL LOW (ref 14–54)
ANION GAP: 9 (ref 5–15)
AST: 20 U/L (ref 15–41)
Alkaline Phosphatase: 115 U/L (ref 38–126)
BUN: 6 mg/dL (ref 6–20)
CO2: 19 mmol/L — AB (ref 22–32)
Calcium: 8.3 mg/dL — ABNORMAL LOW (ref 8.9–10.3)
Chloride: 107 mmol/L (ref 101–111)
Creatinine, Ser: 0.5 mg/dL (ref 0.44–1.00)
GFR calc Af Amer: >60 mL/min (ref >60)
GFR calc non Af Amer: >60 mL/min (ref >60)
GLUCOSE: 92 mg/dL (ref 65–99)
POTASSIUM: 4 mmol/L (ref 3.5–5.1)
SODIUM: 135 mmol/L (ref 135–145)
Total Bilirubin: 0.3 mg/dL (ref 0.3–1.2)
Total Protein: 5.1 g/dL — ABNORMAL LOW (ref 6.5–8.1)

## 2017-04-18 LAB — RPR: RPR Ser Ql: NONREACTIVE

## 2017-04-18 MED ORDER — DIBUCAINE 1 % RE OINT: 1.0000 "application " | TOPICAL_OINTMENT | RECTAL | Status: DC | PRN

## 2017-04-18 MED ORDER — OXYCODONE-ACETAMINOPHEN 5-325 MG PO TABS: 2.0000 | ORAL_TABLET | ORAL | Status: DC | PRN

## 2017-04-18 MED ORDER — IBUPROFEN 600 MG PO TABS: 600.0000 mg | ORAL_TABLET | Freq: Four times a day (QID) | ORAL | 0 refills | Status: AC

## 2017-04-18 MED ORDER — ONDANSETRON HCL 4 MG PO TABS: 4.0000 mg | ORAL_TABLET | ORAL | Status: DC | PRN

## 2017-04-18 MED ORDER — BENZOCAINE-MENTHOL 20-0.5 % EX AERO
1.0000 "application " | INHALATION_SPRAY | CUTANEOUS | Status: DC | PRN
  Administered 2017-04-18: 1 via TOPICAL
  Filled 2017-04-18: qty 56

## 2017-04-18 MED ORDER — TETANUS-DIPHTH-ACELL PERTUSSIS 5-2.5-18.5 LF-MCG/0.5 IM SUSP: 0.5000 mL | Freq: Once | INTRAMUSCULAR | Status: DC

## 2017-04-18 MED ORDER — ZOLPIDEM TARTRATE 5 MG PO TABS: 5.0000 mg | ORAL_TABLET | Freq: Every evening | ORAL | Status: DC | PRN

## 2017-04-18 MED ORDER — LABETALOL HCL 5 MG/ML IV SOLN: 20.0000 mg | INTRAVENOUS | Status: DC | PRN

## 2017-04-18 MED ORDER — PRENATAL MULTIVITAMIN CH
1.0000 | ORAL_TABLET | Freq: Every day | ORAL | Status: DC
  Administered 2017-04-18: 1 via ORAL
  Filled 2017-04-18: qty 1

## 2017-04-18 MED ORDER — ACETAMINOPHEN 325 MG PO TABS: 650.0000 mg | ORAL_TABLET | ORAL | Status: DC | PRN

## 2017-04-18 MED ORDER — COCONUT OIL OIL: 1.0000 "application " | TOPICAL_OIL | Status: DC | PRN

## 2017-04-18 MED ORDER — MISOPROSTOL 200 MCG PO TABS: 800.0000 ug | ORAL_TABLET | Freq: Once | ORAL | Status: DC | PRN

## 2017-04-18 MED ORDER — MAGNESIUM HYDROXIDE 400 MG/5ML PO SUSP: 30.0000 mL | ORAL | Status: DC | PRN

## 2017-04-18 MED ORDER — HYDRALAZINE HCL 20 MG/ML IJ SOLN: 5.0000 mg | INTRAMUSCULAR | Status: DC | PRN

## 2017-04-18 MED ORDER — OXYCODONE-ACETAMINOPHEN 5-325 MG PO TABS: 1.0000 | ORAL_TABLET | ORAL | Status: DC | PRN

## 2017-04-18 MED ORDER — SIMETHICONE 80 MG PO CHEW: 80.0000 mg | CHEWABLE_TABLET | ORAL | Status: DC | PRN

## 2017-04-18 MED ORDER — SENNOSIDES-DOCUSATE SODIUM 8.6-50 MG PO TABS
2.0000 | ORAL_TABLET | ORAL | Status: DC
  Administered 2017-04-18: 2 via ORAL
  Filled 2017-04-18: qty 2

## 2017-04-18 MED ORDER — ONDANSETRON HCL 4 MG/2ML IJ SOLN: 4.0000 mg | INTRAMUSCULAR | Status: DC | PRN

## 2017-04-18 MED ORDER — OXYTOCIN 40 UNITS IN LACTATED RINGERS INFUSION - SIMPLE MED: 2.5000 [IU]/h | INTRAVENOUS | Status: DC | PRN

## 2017-04-18 MED ORDER — WITCH HAZEL-GLYCERIN EX PADS: 1.0000 "application " | MEDICATED_PAD | CUTANEOUS | Status: DC | PRN

## 2017-04-18 MED ORDER — DIPHENHYDRAMINE HCL 25 MG PO CAPS: 25.0000 mg | ORAL_CAPSULE | Freq: Four times a day (QID) | ORAL | Status: DC | PRN

## 2017-04-18 NOTE — Lactation Note (Signed)
This note was copied from a baby's chart. Lactation Consultation Note  Patient Name: Holly Stokes ZOXWR'UToday's Date: 04/18/2017 Reason for consult: Initial assessment   P5, 14 hours old and out of room for hearing screen. Mother breastfed other children for 6-12 months.   She feels confident about breastfeeding.  No concerns or questions. Mom encouraged to feed baby 8-12 times/24 hours and with feeding cues.  Mom made aware of O/P services, breastfeeding support groups, community resources, and our phone # for post-discharge questions.  Briefly discussed basics.   Maternal Data Has patient been taught Hand Expression?: Yes Does the patient have breastfeeding experience prior to this delivery?: Yes  Feeding Feeding Type: Breast Fed Length of feed: 15 min  LATCH Score                   Interventions    Lactation Tools Discussed/Used     Consult Status Consult Status: Complete    Hardie PulleyBerkelhammer, Bill Yohn Boschen 04/18/2017, 12:17 PM

## 2017-04-18 NOTE — Discharge Summary (Signed)
OB Discharge Summary     Patient Name: Holly Stokes DOB: 10/29/1975 MRN: [id number]  Date of admission: 04/17/2017 Delivering MD: Geryl RankinsVARNADO, Marlinda Miranda   Date of discharge: 04/18/2017  Admitting diagnosis: INDUCTION Intrauterine pregnancy: 6486w4d     Secondary diagnosis:  Active Problems:   Normal intrauterine pregnancy in third trimester  Additional problems: AMA     Discharge diagnosis: Term Pregnancy Delivered                                                                                                Post partum procedures:None  Augmentation: AROM and Pitocin  Complications: None  Hospital course:  Induction of Labor With Vaginal Delivery   41 y.o. yo [id number] at 4286w4d was admitted to the hospital 04/17/2017 for induction of labor.  Indication for induction: Elective.  Patient had an uncomplicated labor course as follows: Membrane Rupture Time/Date: 5:09 PM ,04/17/2017   Intrapartum Procedures: Episiotomy: None [1]                                         Lacerations:  Perineal [11];1st degree [2];Vaginal [6]  Patient had delivery of a Viable infant.  Information for the patient's newborn:  Driscilla GrammesSnyder, Boy Kimyata [[id number]][[id number]]  Delivery Method: Vaginal, Spontaneous Delivery (Filed from Delivery Summary)   04/17/2017  Details of delivery can be found in separate delivery note.  Patient had a routine postpartum course. Patient is discharged home 04/18/17.  Physical exam  Vitals:   04/17/17 2359 04/18/17 0104 04/18/17 0513 04/18/17 1800  BP: (!) 107/50 (!) 124/57 117/68 117/64  Pulse: 86 60 77 71  Resp: 18 18 20 19   Temp: 98.5 F (36.9 C) 98.6 F (37 C) 98.4 F (36.9 C) 98.5 F (36.9 C)  TempSrc: Oral Oral Oral Oral  SpO2: 100% 99% 100% 99%  Weight:      Height:       General: alert, cooperative and no distress Lochia: appropriate Uterine Fundus: firm Incision: N/A DVT Evaluation: No evidence of DVT seen on physical exam. Labs: Lab Results  Component Value Date   WBC 14.2  (H) 04/18/2017   HGB 10.1 (L) 04/18/2017   HCT 30.3 (L) 04/18/2017   MCV 81.9 04/18/2017   PLT 223 04/18/2017   CMP Latest Ref Rng & Units 04/18/2017  Glucose 65 - 99 mg/dL 92  BUN 6 - 20 mg/dL 6  Creatinine 3.240.44 - 4.011.00 mg/dL 0.270.50  Sodium 253135 - 664145 mmol/L 135  Potassium 3.5 - 5.1 mmol/L 4.0  Chloride 101 - 111 mmol/L 107  CO2 22 - 32 mmol/L 19(L)  Calcium 8.9 - 10.3 mg/dL 8.3(L)  Total Protein 6.5 - 8.1 g/dL 5.1(L)  Total Bilirubin 0.3 - 1.2 mg/dL 0.3  Alkaline Phos 38 - 126 U/L 115  AST 15 - 41 U/L 20  ALT 14 - 54 U/L 13(L)    Discharge instruction: per After Visit Summary and "Baby and Me Booklet".  After visit meds:  Allergies as of 04/18/2017   No  Known Allergies     Medication List    STOP taking these medications   zolpidem 5 MG tablet Commonly known as:  AMBIEN     TAKE these medications   diphenhydramine-acetaminophen 25-500 MG Tabs tablet Commonly known as:  TYLENOL PM Take 1 tablet by mouth at bedtime as needed (sleep).   ibuprofen 600 MG tablet Commonly known as:  ADVIL,MOTRIN Take 1 tablet (600 mg total) by mouth every 6 (six) hours.   prenatal multivitamin Tabs tablet Take 1 tablet by mouth daily at 12 noon.            Discharge Care Instructions        Start     Ordered   04/19/17 0000  ibuprofen (ADVIL,MOTRIN) 600 MG tablet  Every 6 hours     04/18/17 1948   04/18/17 0000  Diet - low sodium heart healthy     04/18/17 1948   04/18/17 0000  Call MD for:  severe uncontrolled pain     04/18/17 1948   04/18/17 0000  Call MD for:  difficulty breathing, headache or visual disturbances     04/18/17 1948   04/18/17 0000  Call MD for:  extreme fatigue     04/18/17 1948   04/18/17 0000  Call MD for:  persistant dizziness or light-headedness     04/18/17 1948   04/18/17 0000  Sexual acrtivity    Comments:  Pelvic rest x 6 weeks   04/18/17 1948   04/17/17 0000  OB RESULTS CONSOLE GC/Chlamydia    Comments:  This external order was created through  the Results Console.   04/17/17 1246   04/17/17 0000  OB RESULTS CONSOLE RPR    Comments:  This external order was created through the Results Console.    04/17/17 1246   04/17/17 0000  OB RESULTS CONSOLE HIV antibody    Comments:  This external order was created through the Results Console.    04/17/17 1246   04/17/17 0000  OB RESULTS CONSOLE Rubella Antibody    Comments:  This external order was created through the Results Console.    04/17/17 1246   04/17/17 0000  OB RESULTS CONSOLE Hepatitis B surface antigen    Comments:  This external order was created through the Results Console.    04/17/17 1246   04/17/17 0000  OB RESULTS CONSOLE ABO/Rh    Comments:  This external order was created through the Results Console.    04/17/17 1246   04/17/17 0000  OB RESULT CONSOLE Group B Strep    Comments:  This external order was created through the Results Console.   04/17/17 1748      Diet: routine diet  Activity: Advance as tolerated. Pelvic rest for 6 weeks.   Outpatient follow up:6 weeks Follow up Appt:No future appointments. Follow up Visit:No Follow-up on file.  Postpartum contraception: Vasectomy  Newborn Data: Live born female  Birth Weight: 7 lb 1.4 oz (3215 g) APGAR: 8, 9  Baby Feeding: Breast Disposition:home with mother  Circumcision will be done outpatient.  04/18/2017 Geryl Rankins, MD

## 2017-04-18 NOTE — Discharge Instructions (Signed)

## 2017-04-20 ENCOUNTER — Inpatient Hospital Stay (HOSPITAL_COMMUNITY)
Admission: AD | Admit: 2017-04-20 | Payer: BLUE CROSS/BLUE SHIELD | Source: Ambulatory Visit | Admitting: Obstetrics & Gynecology

## 2018-06-07 DIAGNOSIS — B356 Tinea cruris: Secondary | ICD-10-CM | POA: Diagnosis not present

## 2018-09-29 DIAGNOSIS — L820 Inflamed seborrheic keratosis: Secondary | ICD-10-CM | POA: Diagnosis not present

## 2018-09-29 DIAGNOSIS — L738 Other specified follicular disorders: Secondary | ICD-10-CM | POA: Diagnosis not present

## 2018-09-29 DIAGNOSIS — D225 Melanocytic nevi of trunk: Secondary | ICD-10-CM | POA: Diagnosis not present

## 2018-09-29 DIAGNOSIS — Z1283 Encounter for screening for malignant neoplasm of skin: Secondary | ICD-10-CM | POA: Diagnosis not present

## 2018-12-29 DIAGNOSIS — R51 Headache: Secondary | ICD-10-CM | POA: Diagnosis not present
